# Patient Record
Sex: Male | Born: 1975 | Race: White | Hispanic: No | Marital: Married | Smoking: Never smoker
Health system: Southern US, Community
[De-identification: ages and names within clinical notes are randomized; demographics above are authoritative.]

## PROBLEM LIST (undated history)

## (undated) DIAGNOSIS — W3400XA Accidental discharge from unspecified firearms or gun, initial encounter: Secondary | ICD-10-CM

## (undated) DIAGNOSIS — Y249XXA Unspecified firearm discharge, undetermined intent, initial encounter: Secondary | ICD-10-CM

## (undated) HISTORY — PX: WISDOM TOOTH EXTRACTION: SHX21

## (undated) HISTORY — PX: OTHER SURGICAL HISTORY: SHX169

---

## 2008-01-27 ENCOUNTER — Emergency Department (HOSPITAL_COMMUNITY): Admission: EM | Admit: 2008-01-27 | Discharge: 2008-01-27 | Payer: Self-pay | Admitting: Emergency Medicine

## 2009-06-25 ENCOUNTER — Emergency Department (HOSPITAL_COMMUNITY): Admission: EM | Admit: 2009-06-25 | Discharge: 2009-06-25 | Payer: Self-pay | Admitting: Emergency Medicine

## 2011-03-30 LAB — BASIC METABOLIC PANEL
BUN: 11
CO2: 23
Calcium: 9.2
Chloride: 103
Creatinine, Ser: 1.26

## 2011-03-30 LAB — DIFFERENTIAL
Basophils Absolute: 0
Basophils Relative: 0
Eosinophils Absolute: 0
Eosinophils Relative: 1
Lymphs Abs: 0.3 — ABNORMAL LOW
Neutrophils Relative %: 92 — ABNORMAL HIGH

## 2011-03-30 LAB — HEPATIC FUNCTION PANEL
ALT: 47
Bilirubin, Direct: 0.6 — ABNORMAL HIGH
Indirect Bilirubin: 2.2 — ABNORMAL HIGH
Total Protein: 6.8

## 2011-03-30 LAB — CBC
HCT: 41.1
MCV: 86.9
Platelets: 185
RDW: 12.7
WBC: 7.2

## 2011-03-30 LAB — LIPASE, BLOOD: Lipase: 18

## 2011-03-30 LAB — POCT CARDIAC MARKERS
CKMB, poc: 2.1
Myoglobin, poc: 63
Troponin i, poc: <0.05
Troponin i, poc: <0.05

## 2011-03-30 LAB — D-DIMER, QUANTITATIVE

## 2015-03-03 ENCOUNTER — Emergency Department (INDEPENDENT_AMBULATORY_CARE_PROVIDER_SITE_OTHER)
Admission: EM | Admit: 2015-03-03 | Discharge: 2015-03-03 | Disposition: A | Discharge status: Home / Self Care | Payer: Worker's Compensation | Source: Home / Self Care | Attending: Family Medicine | Admitting: Family Medicine

## 2015-03-03 ENCOUNTER — Encounter (HOSPITAL_COMMUNITY): Payer: Self-pay | Admitting: Emergency Medicine

## 2015-03-03 DIAGNOSIS — M542 Cervicalgia: Secondary | ICD-10-CM | POA: Diagnosis not present

## 2015-03-03 MED ORDER — IBUPROFEN 800 MG PO TABS: 800.0000 mg | ORAL_TABLET | Freq: Three times a day (TID) | ORAL | Status: DC

## 2015-03-03 MED ORDER — CYCLOBENZAPRINE HCL 5 MG PO TABS: 5.0000 mg | ORAL_TABLET | Freq: Three times a day (TID) | ORAL | Status: DC | PRN
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
## 2015-03-03 NOTE — Discharge Instructions (Signed)
Use heat and medicine as needed, ok to work as scheduled, return to [facility] if further problems
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
## 2016-12-03 ENCOUNTER — Other Ambulatory Visit: Payer: Self-pay | Admitting: Occupational Medicine

## 2016-12-03 ENCOUNTER — Ambulatory Visit
Admission: RE | Admit: 2016-12-03 | Discharge: 2016-12-03 | Disposition: A | Discharge status: Home / Self Care | Payer: No Typology Code available for payment source | Source: Ambulatory Visit | Attending: Occupational Medicine | Admitting: Occupational Medicine

## 2016-12-03 DIAGNOSIS — Z0289 Encounter for other administrative examinations: Secondary | ICD-10-CM

## 2018-01-28 ENCOUNTER — Emergency Department (HOSPITAL_COMMUNITY): Payer: 59

## 2018-01-28 ENCOUNTER — Emergency Department (HOSPITAL_COMMUNITY)
Admission: EM | Admit: 2018-01-28 | Discharge: 2018-01-28 | Disposition: A | Discharge status: Home / Self Care | Payer: 59 | Attending: Emergency Medicine | Admitting: Emergency Medicine

## 2018-01-28 ENCOUNTER — Other Ambulatory Visit: Payer: Self-pay

## 2018-01-28 ENCOUNTER — Encounter (HOSPITAL_COMMUNITY): Payer: Self-pay

## 2018-01-28 DIAGNOSIS — Z79899 Other long term (current) drug therapy: Secondary | ICD-10-CM | POA: Diagnosis not present

## 2018-01-28 DIAGNOSIS — R1013 Epigastric pain: Secondary | ICD-10-CM | POA: Insufficient documentation

## 2018-01-28 DIAGNOSIS — R109 Unspecified abdominal pain: Secondary | ICD-10-CM | POA: Diagnosis present

## 2018-01-28 HISTORY — DX: Accidental discharge from unspecified firearms or gun, initial encounter: W34.00XA

## 2018-01-28 HISTORY — DX: Unspecified firearm discharge, undetermined intent, initial encounter: Y24.9XXA

## 2018-01-28 LAB — CBC
HEMATOCRIT: 45.5 % (ref 39.0–52.0)
HEMOGLOBIN: 15.7 g/dL (ref 13.0–17.0)
MCH: 30.3 pg (ref 26.0–34.0)
MCHC: 34.5 g/dL (ref 30.0–36.0)
MCV: 87.8 fL (ref 78.0–100.0)
Platelets: 282 10*3/uL (ref 150–400)
RBC: 5.18 MIL/uL (ref 4.22–5.81)
RDW: 13.6 % (ref 11.5–15.5)
WBC: 6.8 10*3/uL (ref 4.0–10.5)

## 2018-01-28 LAB — COMPREHENSIVE METABOLIC PANEL
ALT: 19 U/L (ref 0–44)
ANION GAP: 9 (ref 5–15)
AST: 23 U/L (ref 15–41)
Albumin: 4.6 g/dL (ref 3.5–5.0)
Alkaline Phosphatase: 35 U/L — ABNORMAL LOW (ref 38–126)
BUN: 15 mg/dL (ref 6–20)
CHLORIDE: 103 mmol/L (ref 98–111)
CO2: 28 mmol/L (ref 22–32)
Calcium: 10.3 mg/dL (ref 8.9–10.3)
Creatinine, Ser: 1.13 mg/dL (ref 0.61–1.24)
GFR calc Af Amer: >60 mL/min (ref >60)
GFR calc non Af Amer: >60 mL/min (ref >60)
Glucose, Bld: 119 mg/dL — ABNORMAL HIGH (ref 70–99)
POTASSIUM: 3.6 mmol/L (ref 3.5–5.1)
Sodium: 140 mmol/L (ref 135–145)
TOTAL PROTEIN: 7.4 g/dL (ref 6.5–8.1)
Total Bilirubin: 0.7 mg/dL (ref 0.3–1.2)

## 2018-01-28 LAB — URINALYSIS, ROUTINE W REFLEX MICROSCOPIC
Bilirubin Urine: NEGATIVE
GLUCOSE, UA: NEGATIVE mg/dL
Hgb urine dipstick: NEGATIVE
KETONES UR: NEGATIVE mg/dL
LEUKOCYTES UA: NEGATIVE
NITRITE: NEGATIVE
PH: 8 (ref 5.0–8.0)
Protein, ur: NEGATIVE mg/dL
SPECIFIC GRAVITY, URINE: 1.003 — AB (ref 1.005–1.030)

## 2018-01-28 LAB — LIPASE, BLOOD: LIPASE: 37 U/L (ref 11–51)

## 2018-01-28 MED ORDER — HYDROMORPHONE HCL 1 MG/ML IJ SOLN
1.0000 mg | Freq: Once | INTRAMUSCULAR | Status: AC
  Administered 2018-01-28: 1 mg via INTRAVENOUS
  Filled 2018-01-28: qty 1

## 2018-01-28 MED ORDER — FAMOTIDINE 20 MG PO TABS: 20.0000 mg | ORAL_TABLET | Freq: Two times a day (BID) | ORAL | 0 refills | Status: DC

## 2018-01-28 MED ORDER — FAMOTIDINE IN NACL 20-0.9 MG/50ML-% IV SOLN
20.0000 mg | Freq: Once | INTRAVENOUS | Status: AC
  Administered 2018-01-28: 20 mg via INTRAVENOUS
  Filled 2018-01-28: qty 50

## 2018-01-28 MED ORDER — ONDANSETRON HCL 4 MG/2ML IJ SOLN
4.0000 mg | Freq: Four times a day (QID) | INTRAMUSCULAR | Status: DC | PRN
  Administered 2018-01-28: 4 mg via INTRAVENOUS
  Filled 2018-01-28: qty 2

## 2018-01-28 MED ORDER — HYDROCODONE-ACETAMINOPHEN 5-325 MG PO TABS: 1.0000 | ORAL_TABLET | Freq: Four times a day (QID) | ORAL | 0 refills | Status: DC | PRN

## 2018-01-28 MED ORDER — IOPAMIDOL (ISOVUE-300) INJECTION 61%
INTRAVENOUS | Status: AC
  Filled 2018-01-28: qty 100

## 2018-01-28 MED ORDER — MORPHINE SULFATE (PF) 4 MG/ML IV SOLN
6.0000 mg | INTRAVENOUS | Status: DC | PRN
  Administered 2018-01-28: 6 mg via INTRAVENOUS
  Filled 2018-01-28: qty 2

## 2018-01-28 MED ORDER — SODIUM CHLORIDE 0.9 % IV SOLN
INTRAVENOUS | Status: DC
  Administered 2018-01-28: 08:00:00 via INTRAVENOUS

## 2018-01-28 MED ORDER — ONDANSETRON HCL 4 MG/2ML IJ SOLN
4.0000 mg | Freq: Once | INTRAMUSCULAR | Status: AC | PRN
  Administered 2018-01-28: 4 mg via INTRAVENOUS
  Filled 2018-01-28: qty 2

## 2018-01-28 MED ORDER — IOPAMIDOL (ISOVUE-300) INJECTION 61%
100.0000 mL | Freq: Once | INTRAVENOUS | Status: AC | PRN
  Administered 2018-01-28: 100 mL via INTRAVENOUS

## 2018-01-28 NOTE — ED Provider Notes (Signed)
Patient with acute onset of epigastric abdominal pain.  Work-up negative from lab standpoint.  Ultrasound negative CT scan abdomen negative.  Peptic ulcer disease not completely ruled out.  Mother did bring up that patient's sister does have celiac disease and it is a possibility.  Will have patient follow-up with GI medicine.  Also will do a course of Pepcid short course of pain medicine as needed.  Will return for any new or worse symptoms.  Patient's pain is currently well controlled.   Vanetta MuldersZackowski, Leshia Kope, MD 01/28/18 [phone]

## 2018-01-28 NOTE — ED Notes (Signed)
States he is feeling better.
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
## 2018-01-28 NOTE — Discharge Instructions (Addendum)
Work-up here today without any acute findings.  Recommend a trial of Pepcid in case this is a stomach ulcer.  Also follow-up with GI medicine call and make an appointment.  As your mother brought up celiac disease is still a possibility and GI medicine could help with this diagnosis.  Return for any new or worse symptoms.  Short course of pain medicine provided as needed.

## 2018-01-28 NOTE — ED Triage Notes (Signed)
Patient states onset abdominal pain at 2100 last night-ate steak,rice, and vegetables at 2000-pain severe since 2230-used tums and pepto with no relief-severe pain/patient doubled over-pain upper mid abdomen to right upper quadrant area-nauseated but no vomiting

## 2018-01-31 ENCOUNTER — Emergency Department (HOSPITAL_COMMUNITY): Payer: 59

## 2018-01-31 ENCOUNTER — Inpatient Hospital Stay (HOSPITAL_COMMUNITY)
Admission: EM | Admit: 2018-01-31 | Discharge: 2018-02-04 | DRG: 419 | Disposition: A | Discharge status: Home / Self Care | Payer: 59 | Attending: General Surgery | Admitting: General Surgery

## 2018-01-31 ENCOUNTER — Emergency Department (HOSPITAL_COMMUNITY)
Admission: EM | Admit: 2018-01-31 | Discharge: 2018-01-31 | Disposition: A | Discharge status: Home / Self Care | Payer: 59 | Source: Home / Self Care | Attending: Emergency Medicine | Admitting: Emergency Medicine

## 2018-01-31 ENCOUNTER — Other Ambulatory Visit: Payer: Self-pay

## 2018-01-31 ENCOUNTER — Encounter (HOSPITAL_COMMUNITY): Payer: Self-pay

## 2018-01-31 ENCOUNTER — Encounter (HOSPITAL_COMMUNITY)
Admission: EM | Disposition: A | Discharge status: Home / Self Care | Payer: Self-pay | Source: Home / Self Care | Attending: Internal Medicine

## 2018-01-31 ENCOUNTER — Encounter (HOSPITAL_COMMUNITY): Payer: Self-pay | Admitting: Emergency Medicine

## 2018-01-31 DIAGNOSIS — K660 Peritoneal adhesions (postprocedural) (postinfection): Secondary | ICD-10-CM | POA: Diagnosis present

## 2018-01-31 DIAGNOSIS — Z79899 Other long term (current) drug therapy: Secondary | ICD-10-CM

## 2018-01-31 DIAGNOSIS — K828 Other specified diseases of gallbladder: Secondary | ICD-10-CM | POA: Diagnosis present

## 2018-01-31 DIAGNOSIS — K279 Peptic ulcer, site unspecified, unspecified as acute or chronic, without hemorrhage or perforation: Secondary | ICD-10-CM | POA: Insufficient documentation

## 2018-01-31 DIAGNOSIS — K811 Chronic cholecystitis: Secondary | ICD-10-CM | POA: Diagnosis not present

## 2018-01-31 DIAGNOSIS — Z880 Allergy status to penicillin: Secondary | ICD-10-CM

## 2018-01-31 DIAGNOSIS — R109 Unspecified abdominal pain: Secondary | ICD-10-CM | POA: Diagnosis not present

## 2018-01-31 DIAGNOSIS — R1013 Epigastric pain: Secondary | ICD-10-CM | POA: Insufficient documentation

## 2018-01-31 HISTORY — PX: BIOPSY: SHX5522

## 2018-01-31 HISTORY — PX: ESOPHAGOGASTRODUODENOSCOPY: SHX5428

## 2018-01-31 LAB — COMPREHENSIVE METABOLIC PANEL
ALBUMIN: 4.9 g/dL (ref 3.5–5.0)
ALT: 27 U/L (ref 0–44)
ALT: 29 U/L (ref 0–44)
ANION GAP: 11 (ref 5–15)
AST: 25 U/L (ref 15–41)
AST: 27 U/L (ref 15–41)
Albumin: 4.8 g/dL (ref 3.5–5.0)
Alkaline Phosphatase: 33 U/L — ABNORMAL LOW (ref 38–126)
Alkaline Phosphatase: 37 U/L — ABNORMAL LOW (ref 38–126)
Anion gap: 8 (ref 5–15)
BILIRUBIN TOTAL: 0.6 mg/dL (ref 0.3–1.2)
BUN: 12 mg/dL (ref 6–20)
BUN: 10 mg/dL (ref 6–20)
CHLORIDE: 103 mmol/L (ref 98–111)
CHLORIDE: 104 mmol/L (ref 98–111)
CO2: 31 mmol/L (ref 22–32)
CO2: 24 mmol/L (ref 22–32)
CREATININE: 1.14 mg/dL (ref 0.61–1.24)
Calcium: 9.3 mg/dL (ref 8.9–10.3)
Calcium: 9.8 mg/dL (ref 8.9–10.3)
Creatinine, Ser: 1.09 mg/dL (ref 0.61–1.24)
GFR calc Af Amer: >60 mL/min (ref >60)
GFR calc non Af Amer: >60 mL/min (ref >60)
GLUCOSE: 106 mg/dL — AB (ref 70–99)
Glucose, Bld: 105 mg/dL — ABNORMAL HIGH (ref 70–99)
POTASSIUM: 3.9 mmol/L (ref 3.5–5.1)
Potassium: 3.9 mmol/L (ref 3.5–5.1)
SODIUM: 142 mmol/L (ref 135–145)
SODIUM: 139 mmol/L (ref 135–145)
TOTAL PROTEIN: 7.7 g/dL (ref 6.5–8.1)
Total Bilirubin: 0.9 mg/dL (ref 0.3–1.2)
Total Protein: 7.7 g/dL (ref 6.5–8.1)

## 2018-01-31 LAB — CBC
HCT: 45.4 % (ref 39.0–52.0)
HEMATOCRIT: 49.1 % (ref 39.0–52.0)
HEMOGLOBIN: 17.1 g/dL — AB (ref 13.0–17.0)
Hemoglobin: 15.9 g/dL (ref 13.0–17.0)
MCH: 30.5 pg (ref 26.0–34.0)
MCH: 30.4 pg (ref 26.0–34.0)
MCHC: 35 g/dL (ref 30.0–36.0)
MCHC: 34.8 g/dL (ref 30.0–36.0)
MCV: 87.1 fL (ref 78.0–100.0)
MCV: 87.2 fL (ref 78.0–100.0)
PLATELETS: 271 10*3/uL (ref 150–400)
Platelets: 296 10*3/uL (ref 150–400)
RBC: 5.21 MIL/uL (ref 4.22–5.81)
RBC: 5.63 MIL/uL (ref 4.22–5.81)
RDW: 13.5 % (ref 11.5–15.5)
RDW: 13.6 % (ref 11.5–15.5)
WBC: 4.8 10*3/uL (ref 4.0–10.5)
WBC: 10.4 10*3/uL (ref 4.0–10.5)

## 2018-01-31 LAB — URINALYSIS, ROUTINE W REFLEX MICROSCOPIC
BILIRUBIN URINE: NEGATIVE
GLUCOSE, UA: NEGATIVE mg/dL
Hgb urine dipstick: NEGATIVE
KETONES UR: NEGATIVE mg/dL
Leukocytes, UA: NEGATIVE
NITRITE: NEGATIVE
Protein, ur: NEGATIVE mg/dL
SPECIFIC GRAVITY, URINE: 1.006 (ref 1.005–1.030)
pH: 8 (ref 5.0–8.0)

## 2018-01-31 LAB — LIPASE, BLOOD
LIPASE: 42 U/L (ref 11–51)
Lipase: 35 U/L (ref 11–51)

## 2018-01-31 LAB — D-DIMER, QUANTITATIVE (NOT AT ARMC)

## 2018-01-31 SURGERY — EGD (ESOPHAGOGASTRODUODENOSCOPY)

## 2018-01-31 MED ORDER — DIPHENHYDRAMINE HCL 50 MG/ML IJ SOLN
INTRAMUSCULAR | Status: AC
  Filled 2018-01-31: qty 1

## 2018-01-31 MED ORDER — MORPHINE SULFATE (PF) 2 MG/ML IV SOLN
2.0000 mg | INTRAVENOUS | Status: DC | PRN
  Administered 2018-02-01: 2 mg via INTRAVENOUS
  Filled 2018-01-31: qty 1

## 2018-01-31 MED ORDER — MORPHINE SULFATE (PF) 4 MG/ML IV SOLN
4.0000 mg | Freq: Once | INTRAVENOUS | Status: AC
  Administered 2018-01-31: 4 mg via INTRAVENOUS
  Filled 2018-01-31: qty 1

## 2018-01-31 MED ORDER — FENTANYL CITRATE (PF) 100 MCG/2ML IJ SOLN
INTRAMUSCULAR | Status: AC
  Filled 2018-01-31: qty 4

## 2018-01-31 MED ORDER — MORPHINE SULFATE 15 MG PO TABS: 15.0000 mg | ORAL_TABLET | Freq: Three times a day (TID) | ORAL | 0 refills | Status: DC | PRN

## 2018-01-31 MED ORDER — FENTANYL CITRATE (PF) 100 MCG/2ML IJ SOLN
50.0000 ug | Freq: Once | INTRAMUSCULAR | Status: AC
  Administered 2018-01-31: 50 ug via INTRAVENOUS
  Filled 2018-01-31: qty 2

## 2018-01-31 MED ORDER — MIDAZOLAM HCL 5 MG/ML IJ SOLN
INTRAMUSCULAR | Status: AC
  Filled 2018-01-31: qty 2

## 2018-01-31 MED ORDER — MORPHINE SULFATE 15 MG PO TABS
15.0000 mg | ORAL_TABLET | Freq: Three times a day (TID) | ORAL | Status: DC | PRN
  Administered 2018-02-03: 15 mg via ORAL
  Filled 2018-01-31: qty 1

## 2018-01-31 MED ORDER — ONDANSETRON HCL 4 MG PO TABS
4.0000 mg | ORAL_TABLET | Freq: Four times a day (QID) | ORAL | Status: DC | PRN
  Filled 2018-01-31: qty 1

## 2018-01-31 MED ORDER — GI COCKTAIL ~~LOC~~
30.0000 mL | Freq: Once | ORAL | Status: AC
  Administered 2018-01-31: 30 mL via ORAL
  Filled 2018-01-31: qty 30

## 2018-01-31 MED ORDER — ONDANSETRON HCL 4 MG/2ML IJ SOLN: 4.0000 mg | Freq: Four times a day (QID) | INTRAMUSCULAR | Status: DC | PRN

## 2018-01-31 MED ORDER — ACETAMINOPHEN 650 MG RE SUPP: 650.0000 mg | Freq: Four times a day (QID) | RECTAL | Status: DC | PRN

## 2018-01-31 MED ORDER — MORPHINE SULFATE 15 MG PO TABS
15.0000 mg | ORAL_TABLET | ORAL | Status: DC | PRN
  Administered 2018-01-31: 15 mg via ORAL
  Filled 2018-01-31: qty 1

## 2018-01-31 MED ORDER — ACETAMINOPHEN 325 MG PO TABS
650.0000 mg | ORAL_TABLET | Freq: Four times a day (QID) | ORAL | Status: DC | PRN
  Filled 2018-01-31: qty 2

## 2018-01-31 MED ORDER — PANTOPRAZOLE SODIUM 40 MG PO TBEC
40.0000 mg | DELAYED_RELEASE_TABLET | Freq: Two times a day (BID) | ORAL | Status: DC
  Administered 2018-02-01 – 2018-02-04 (×6): 40 mg via ORAL
  Filled 2018-01-31 (×7): qty 1

## 2018-01-31 MED ORDER — KCL IN DEXTROSE-NACL 20-5-0.45 MEQ/L-%-% IV SOLN
Freq: Once | INTRAVENOUS | Status: AC
  Administered 2018-01-31: 21:00:00 via INTRAVENOUS
  Filled 2018-01-31: qty 1000

## 2018-01-31 MED ORDER — PANTOPRAZOLE SODIUM 20 MG PO TBEC: 20.0000 mg | DELAYED_RELEASE_TABLET | Freq: Two times a day (BID) | ORAL | Status: DC

## 2018-01-31 MED ORDER — ONDANSETRON HCL 4 MG PO TABS: 4.0000 mg | ORAL_TABLET | Freq: Four times a day (QID) | ORAL | Status: DC | PRN

## 2018-01-31 MED ORDER — ENOXAPARIN SODIUM 40 MG/0.4ML ~~LOC~~ SOLN
40.0000 mg | Freq: Every day | SUBCUTANEOUS | Status: DC
  Filled 2018-01-31 (×2): qty 0.4

## 2018-01-31 MED ORDER — SUCRALFATE 1 GM/10ML PO SUSP: 1.0000 g | Freq: Three times a day (TID) | ORAL | 0 refills | Status: DC

## 2018-01-31 MED ORDER — ACETAMINOPHEN 650 MG RE SUPP
650.0000 mg | Freq: Four times a day (QID) | RECTAL | Status: DC | PRN
  Filled 2018-01-31: qty 1

## 2018-01-31 MED ORDER — PANTOPRAZOLE SODIUM 40 MG PO TBEC
80.0000 mg | DELAYED_RELEASE_TABLET | Freq: Every day | ORAL | Status: DC
  Administered 2018-01-31: 80 mg via ORAL
  Filled 2018-01-31: qty 2

## 2018-01-31 MED ORDER — ACETAMINOPHEN 325 MG PO TABS: 650.0000 mg | ORAL_TABLET | Freq: Four times a day (QID) | ORAL | Status: DC | PRN

## 2018-01-31 MED ORDER — SUCRALFATE 1 G PO TABS
1.0000 g | ORAL_TABLET | Freq: Once | ORAL | Status: AC
  Administered 2018-01-31: 1 g via ORAL
  Filled 2018-01-31: qty 1

## 2018-01-31 MED ORDER — BUTAMBEN-TETRACAINE-BENZOCAINE 2-2-14 % EX AERO
INHALATION_SPRAY | CUTANEOUS | Status: DC | PRN
  Administered 2018-01-31: 2 via TOPICAL

## 2018-01-31 MED ORDER — PANTOPRAZOLE SODIUM 20 MG PO TBEC: 20.0000 mg | DELAYED_RELEASE_TABLET | Freq: Two times a day (BID) | ORAL | 0 refills | Status: DC

## 2018-01-31 MED ORDER — HYDRALAZINE HCL 20 MG/ML IJ SOLN: 10.0000 mg | INTRAMUSCULAR | Status: DC | PRN

## 2018-01-31 MED ORDER — ALBUTEROL SULFATE (2.5 MG/3ML) 0.083% IN NEBU: 2.5000 mg | INHALATION_SOLUTION | RESPIRATORY_TRACT | Status: DC | PRN
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
## 2018-01-31 NOTE — ED Notes (Signed)
XR at bedside
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
## 2018-01-31 NOTE — ED Notes (Signed)
Pt aware of need for urine specimen. Urinal placed at bedside.  

## 2018-01-31 NOTE — ED Notes (Signed)
GI at bedside

## 2018-01-31 NOTE — Progress Notes (Addendum)
EGD normal, negative for source of abdominal pain.  Have ordered CT of abdomen with oral contrast to see if anything has evolved radiographically in the past 72 hours.  Will cover with PPI therapy.  Case discussed with patient, father, and hospitalist.  NOTE: Pt's mother points out that there's a family history of gallbladder disease in his sister, who had severe pain similar to this patient, but had a negative ultrasound for stones or sludge.  If repeat CT is unrevealing, we should consider a CCK-stimulated hepatobiliary scan.  We will follow with you over the weekend.  Florencia Reasonsobert V. Minnette Merida, M.D. Pager [phone] If no answer or after 5 PM call [phone]

## 2018-01-31 NOTE — ED Notes (Signed)
Patient reports pain unchanged with morphine. MD made aware.
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
## 2018-01-31 NOTE — ED Notes (Signed)
Bed: WA17 Expected date:  Expected time:  Means of arrival:  Comments: Hold for endo patient coming back

## 2018-01-31 NOTE — ED Notes (Signed)
Bed: WLPT4 Expected date:  Expected time:  Means of arrival:  Comments: 

## 2018-01-31 NOTE — Discharge Instructions (Addendum)
We suspect that your pain is because of peptic ulcer disease. Please continue with the plan to follow-up with the GI doctor.  As discussed, HIDA scan might help out with the diagnosis if this is a gallbladder pathology that does not involve gallstones. The GI doctor can order the HIDA scan if they feel it is necessary.  Keep taking the medications that are prescribed. Refrain from eating spicy food.

## 2018-01-31 NOTE — ED Triage Notes (Signed)
Pt from home with c/o central epigastric pain that began around 2100 tonight. Pt states he has been seen for same with no change, just increase in pain. Pt states he took Pepcid and pain med as instructed with no relief

## 2018-01-31 NOTE — Op Note (Signed)
[facility] Patient Name: Daniel Skinner Procedure Date: 01/31/2018 MRN: [id number] Attending MD: Bernette Redbird , MD Date of Birth: Jun 06, 1976 CSN: [id number] Age: 42 Admit Type: Inpatient Procedure:                Upper GI endoscopy Indications:              Epigastric abdominal pain, intense, on and off for                            several days, u/s and CT neg. Also minimal                            hematemesis during dry heaves. Providers:                Bernette Redbird, MD, Jacquiline Doe, RN, Verita Schneiders, Technician Referring MD:              Medicines:                Benzocaine spray NO IV SEDATION Complications:            No immediate complications. Estimated Blood Loss:     Estimated blood loss was minimal. Procedure:                Pre-Anesthesia Assessment:                           - Prior to the procedure, a History and Physical                            was performed, and patient medications and                            allergies were reviewed. The patient's tolerance of                            previous anesthesia was also reviewed. The risks                            and benefits of the procedure and the sedation                            options and risks were discussed with the patient.                            All questions were answered, and informed consent                            was obtained. Prior Anticoagulants: The patient has                            taken no previous anticoagulant or antiplatelet  agents. ASA Grade Assessment: II - A patient with                            mild systemic disease. After reviewing the risks                            and benefits, the patient was deemed in                            satisfactory condition to undergo the procedure.                           After obtaining informed consent, the endoscope was                            passed  under direct vision. Throughout the                            procedure, the patient's blood pressure, pulse, and                            oxygen saturations were monitored continuously. The                            [id number]-[id number] ([id number]) Olympus Ultra Slim EGD was                            introduced through the mouth, and advanced to the                            third part of duodenum. The upper GI endoscopy was                            accomplished without difficulty. The patient                            tolerated the procedure well. Scope In: Scope Out: Findings:      The larynx was normal.      The examined esophagus was normal.      Retained fluid (slightly bile-tinged) with a small amount of food       debris, was found in the gastric fundus.      The exam of the stomach was otherwise normal.      The cardia and gastric fundus were normal on retroflexion.      Biopsies were taken with a cold forceps in the gastric antrum for       histology. Estimated blood loss was minimal.      The examined duodenum was normal. Biopsies were taken with a cold       forceps for histology. Estimated blood loss was minimal. Impression:               - Normal larynx.                           - Normal esophagus.                           -  Retained gastric fluid.                           - Normal examined duodenum. Biopsied.                           - Biopsies were taken with a cold forceps for                            histology in the gastric antrum.                           - No evidence of ischemia, erosions, ulcers,                            gastric outlet obstruction, or other cause for                            abdominal pain seen. Moderate Sedation:      No IV sedation administered. Recommendation:           - Await pathology results.                           - Empiric PPI therapy.                           - Repeat CT of abdomen, this time with oral                             contrast.                           - Observe patient's clinical course. Procedure Code(s):        --- Professional ---                           [id number], Esophagogastroduodenoscopy, flexible,                            transoral; with biopsy, single or multiple Diagnosis Code(s):        --- Professional ---                           R10.13, Epigastric pain CPT copyright 2017 American Medical Association. All rights reserved. The codes documented in this report are preliminary and upon coder review may  be revised to meet current compliance requirements. Bernette Redbirdobert Tanice Petre, MD 01/31/2018 10:45:02 PM This report has been signed electronically. Number of Addenda: 0

## 2018-01-31 NOTE — ED Triage Notes (Signed)
Pt c/o of continued central abdominal pain. Pt is doubled over in pain and in obvious disress. He also reports hematemesis. Endorses normal urination and bowel movements. A&Ox4.

## 2018-01-31 NOTE — ED Notes (Signed)
EKG given to EDP,Long,MD., for review. 

## 2018-01-31 NOTE — ED Notes (Addendum)
Patient's mother, Talbert ForestShirley, [phone].

## 2018-02-01 ENCOUNTER — Encounter (HOSPITAL_COMMUNITY): Payer: Self-pay

## 2018-02-01 ENCOUNTER — Observation Stay (HOSPITAL_COMMUNITY): Payer: 59

## 2018-02-01 DIAGNOSIS — R109 Unspecified abdominal pain: Secondary | ICD-10-CM | POA: Diagnosis not present

## 2018-02-01 LAB — IRON AND TIBC
Iron: 107 ug/dL (ref 45–182)
SATURATION RATIOS: 22 % (ref 17.9–39.5)
TIBC: 497 ug/dL — AB (ref 250–450)
UIBC: 390 ug/dL

## 2018-02-01 LAB — COMPREHENSIVE METABOLIC PANEL
ALBUMIN: 4.4 g/dL (ref 3.5–5.0)
ALK PHOS: 36 U/L — AB (ref 38–126)
ALT: 25 U/L (ref 0–44)
AST: 23 U/L (ref 15–41)
Anion gap: 10 (ref 5–15)
BILIRUBIN TOTAL: 1 mg/dL (ref 0.3–1.2)
BUN: 12 mg/dL (ref 6–20)
CO2: 25 mmol/L (ref 22–32)
CREATININE: 1.1 mg/dL (ref 0.61–1.24)
Calcium: 9.3 mg/dL (ref 8.9–10.3)
Chloride: 107 mmol/L (ref 98–111)
GFR calc non Af Amer: >60 mL/min (ref >60)
GLUCOSE: 101 mg/dL — AB (ref 70–99)
Potassium: 4.5 mmol/L (ref 3.5–5.1)
Sodium: 142 mmol/L (ref 135–145)
TOTAL PROTEIN: 7 g/dL (ref 6.5–8.1)

## 2018-02-01 LAB — RAPID URINE DRUG SCREEN, HOSP PERFORMED
AMPHETAMINES: NOT DETECTED
BENZODIAZEPINES: NOT DETECTED
Barbiturates: POSITIVE — AB
COCAINE: NOT DETECTED
OPIATES: POSITIVE — AB
Tetrahydrocannabinol: NOT DETECTED

## 2018-02-01 LAB — RETICULOCYTES
RBC.: 5.72 MIL/uL (ref 4.22–5.81)
RETIC COUNT ABSOLUTE: 57.2 10*3/uL (ref 19.0–186.0)
RETIC CT PCT: 1 % (ref 0.4–3.1)

## 2018-02-01 LAB — CBC
HCT: 48.4 % (ref 39.0–52.0)
Hemoglobin: 16.7 g/dL (ref 13.0–17.0)
MCH: 30.4 pg (ref 26.0–34.0)
MCHC: 34.5 g/dL (ref 30.0–36.0)
MCV: 88 fL (ref 78.0–100.0)
Platelets: 306 10*3/uL (ref 150–400)
RBC: 5.5 MIL/uL (ref 4.22–5.81)
RDW: 13.5 % (ref 11.5–15.5)
WBC: 8.9 10*3/uL (ref 4.0–10.5)

## 2018-02-01 LAB — LACTIC ACID, PLASMA: Lactic Acid, Venous: 0.8 mmol/L (ref 0.5–1.9)

## 2018-02-01 LAB — HIV ANTIBODY (ROUTINE TESTING W REFLEX): HIV SCREEN 4TH GENERATION: NONREACTIVE

## 2018-02-01 LAB — FERRITIN: FERRITIN: 252 ng/mL (ref 24–336)

## 2018-02-01 LAB — FOLATE: Folate: 17.2 ng/mL (ref >5.9)

## 2018-02-01 LAB — VITAMIN B12: VITAMIN B 12: 424 pg/mL (ref 180–914)

## 2018-02-01 MED ORDER — IOPAMIDOL (ISOVUE-300) INJECTION 61%
100.0000 mL | Freq: Once | INTRAVENOUS | Status: AC | PRN
  Administered 2018-02-01: 100 mL via INTRAVENOUS

## 2018-02-01 MED ORDER — SODIUM CHLORIDE 0.9 % IV SOLN
INTRAVENOUS | Status: DC
  Administered 2018-02-01 – 2018-02-04 (×6): via INTRAVENOUS

## 2018-02-01 MED ORDER — MORPHINE SULFATE (PF) 4 MG/ML IV SOLN
4.0000 mg | INTRAVENOUS | Status: DC | PRN
  Administered 2018-02-01 – 2018-02-04 (×7): 4 mg via INTRAVENOUS
  Filled 2018-02-01 (×7): qty 1

## 2018-02-01 MED ORDER — SUCRALFATE 1 GM/10ML PO SUSP
1.0000 g | Freq: Three times a day (TID) | ORAL | Status: DC
  Administered 2018-02-01 – 2018-02-03 (×9): 1 g via ORAL
  Filled 2018-02-01 (×10): qty 10

## 2018-02-01 MED ORDER — SODIUM CHLORIDE 0.9 % IV SOLN: INTRAVENOUS | Status: DC

## 2018-02-01 MED ORDER — IOPAMIDOL (ISOVUE-300) INJECTION 61%
INTRAVENOUS | Status: AC
  Filled 2018-02-01: qty 100

## 2018-02-01 MED ORDER — IOHEXOL 300 MG/ML  SOLN: 30.0000 mL | Freq: Once | INTRAMUSCULAR | Status: DC | PRN

## 2018-02-01 NOTE — Progress Notes (Signed)
[facility] PROGRESS NOTE    Progress Note  Daniel MurdochJeremy L Seymore  BJY:[id number] DOB: Oct 04, 1975 DOA: 01/31/2018 PCP: Patient, No Pcp Per     Brief Narrative:   Daniel Skinner is an 42 y.o. male without a significant past medical history who presented with intermittent epigastric pain over the last 4 days from the navel to the sternum radiates to the right quadrant, CT scan of the abdomen and pelvis was done  Assessment/Plan:   Principal Problem:   Recurrent abdominal pain And pelvis done on 01/28/2018 showed no acute findings. ED done on 01/2018 showed no acute findings he was started empirically on PPI. Tolerating a clear liquid diet. KUB showed no acute findings. Abdominal ultrasound showed no acute findings. Alkaline phosphatase is 36 AST and ALT are 23 and 25 his total bilirubin is 1 total protein is 7. Repeated CT scan of the abdomen pelvis with contrast is pending.  DVT prophylaxis: lovenox Family Communication:mother and father Disposition Plan/Barrier to D/C: none Code Status:     Code Status Orders  (From admission, onward)        Start     Ordered   01/31/18 2254  Full code  Continuous     01/31/18 2255    Code Status History    This patient has a current code status but no historical code status.        IV Access:    Peripheral IV   Procedures and diagnostic studies:   Dg Abdomen 1 View  Result Date: 01/31/2018 CLINICAL DATA:  Abdominal pain since 2 a.m. today. EXAM: ABDOMEN - 1 VIEW COMPARISON:  Abdomen pelvis CT dated 01/28/2018. FINDINGS: Normal bowel gas pattern.  Minimal scoliosis. IMPRESSION: No acute abnormality. Electronically Signed   By: Beckie SaltsSteven  Reid M.D.   On: 01/31/2018 19:09     Medical Consultants:    None.  Anti-Infectives:   None  Subjective:    Daniel MurdochJeremy L Miyasaki he relates he continues to have abdominal pain.  Objective:    Vitals:   02/01/18 0000 02/01/18 0030 02/01/18 0146 02/01/18 0601  BP: 118/73 129/88  (!) 148/95   Pulse: (!) 56 (!) 54 (!) 59   Resp: 12 17 18    Temp:   98.2 F (36.8 C)   TempSrc:   Oral   SpO2: 95% 95% 100%   Weight:    70.3 kg (155 lb)  Height:    5\' 8"  (1.727 m)    Intake/Output Summary (Last 24 hours) at 02/01/2018 0851 Last data filed at 02/01/2018 0844 Gross per 24 hour  Intake 121.08 ml  Output -  Net 121.08 ml   Filed Weights   02/01/18 0601  Weight: 70.3 kg (155 lb)    Exam: General exam: In no acute distress. Respiratory system: Good air movement and clear to auscultation. Cardiovascular system: S1 & S2 heard, RRR. No JVD. Gastrointestinal system: Abdomen is nondistended, soft and nontender.  Central nervous system: Alert and oriented. No focal neurological deficits. Extremities: No pedal edema. Skin: No rashes, lesions or ulcers Psychiatry: Judgement and insight appear normal. Mood & affect appropriate.    Data Reviewed:    Labs: Basic Metabolic Panel: Recent Labs  Lab 01/28/18 0502 01/31/18 0313 01/31/18 1800 02/01/18 0518  NA 140 142 139 142  K 3.6 3.9 3.9 4.5  CL 103 103 104 107  CO2 28 31 24 25   GLUCOSE 119* 105* 106* 101*  BUN 15 12 10 12   CREATININE 1.13 1.14 1.09 1.10  CALCIUM  10.3 9.3 9.8 9.3   GFR Estimated Creatinine Clearance: 86.4 mL/min (by C-G formula based on SCr of 1.1 mg/dL). Liver Function Tests: Recent Labs  Lab 01/28/18 0502 01/31/18 0313 01/31/18 1800 02/01/18 0518  AST 23 25 27 23   ALT 19 27 29 25   ALKPHOS 35* 33* 37* 36*  BILITOT 0.7 0.6 0.9 1.0  PROT 7.4 7.7 7.7 7.0  ALBUMIN 4.6 4.8 4.9 4.4   Recent Labs  Lab 01/28/18 0502 01/31/18 0313 01/31/18 1800  LIPASE 37 35 42   No results for input(s): AMMONIA in the last 168 hours. Coagulation profile No results for input(s): INR, PROTIME in the last 168 hours.  CBC: Recent Labs  Lab 01/28/18 0502 01/31/18 0313 01/31/18 1800 02/01/18 0518  WBC 6.8 4.8 10.4 8.9  HGB 15.7 15.9 17.1* 16.7  HCT 45.5 45.4 49.1 48.4  MCV 87.8 87.1 87.2 88.0    PLT 282 271 296 306   Cardiac Enzymes: No results for input(s): CKTOTAL, CKMB, CKMBINDEX, TROPONINI in the last 168 hours. BNP (last 3 results) No results for input(s): PROBNP in the last 8760 hours. CBG: No results for input(s): GLUCAP in the last 168 hours. D-Dimer: Recent Labs    01/31/18 1834  DDIMER <0.27   Hgb A1c: No results for input(s): HGBA1C in the last 72 hours. Lipid Profile: No results for input(s): CHOL, HDL, LDLCALC, TRIG, CHOLHDL, LDLDIRECT in the last 72 hours. Thyroid function studies: No results for input(s): TSH, T4TOTAL, T3FREE, THYROIDAB in the last 72 hours.  Invalid input(s): FREET3 Anemia work up: No results for input(s): VITAMINB12, FOLATE, FERRITIN, TIBC, IRON, RETICCTPCT in the last 72 hours. Sepsis Labs: Recent Labs  Lab 01/28/18 0502 01/31/18 0313 01/31/18 1800 01/31/18 2330 02/01/18 0518  WBC 6.8 4.8 10.4  --  8.9  LATICACIDVEN  --   --   --  0.8  --    Microbiology No results found for this or any previous visit (from the past 240 hour(s)).   Medications:   . enoxaparin (LOVENOX) injection  40 mg Subcutaneous QHS  . pantoprazole  40 mg Oral BID AC   Continuous Infusions: . sodium chloride    . sodium chloride 75 mL/hr at 02/01/18 0844     LOS: 0 days   Marinda Elk  [facility] Pager [phone]  *Please refer to [email or web address], password [email or web address] to get updated schedule on who will round on this patient, as hospitalists switch teams weekly. If 7PM-7AM, please contact night-coverage at [email or web address], password [email or web address] for any overnight needs.  02/01/2018, 8:51 AM

## 2018-02-01 NOTE — Progress Notes (Signed)
Repeat CT scan was unrevealing for evolution of any identifiable process to account for his pain, such as ischemic small bowel disease.  I checked with radiology; the gallbladder ejection fraction nuclear medicine scan is not available on weekends.  At my recommendation, the patient was holding off on pain medication until we clarified nuclear medicine study.  Whether he would be having the he therefore went all day without pain medication, and although he has not had any of the excruciating pain that prompted him to come into the hospital yesterday, he has had a gradual escalation of the severity of his ongoing discomfort, from a level of 3 to perhaps 5.  On exam, the patient is in no distress, bowel sounds are present, no overt abdominal tenderness.  Impression: Recent onset of severe upper abdominal pain of unclear cause.  At the moment, we wonder about possible acalculous gallbladder disease, since there is a family history of that in his sister, as well as gallbladder disease with sludge in his mother.  Other possibilities would be some sort of mesenteric mesenteric vasculitis.  There is a family history of celiac disease although the presentation does not seem compatible with that disorder.  Plan:  1.  Await duodenal and gastric biopsies 2.  Check for markers of inflammation (CRP and sed rate) (ordered) 3.  Check celiac serologies (ordered) 4.  I encouraged the patient to use his pain medicine before the pain escalates to very high levels.  Also, if the currently ordered pain medication dose is not sufficient to mitigate the pain reasonably well, he should notify his nurse who could notify the attending hospitalist physician to consider increasing the dose of medication.  I have discussed this with the hospitalist physician. 5.  I have ordered the nuclear medicine gallbladder ejection fraction study for Monday, along with an n.p.o. order, and I have advised the patient to try to avoid use of  opiate medication starting midnight Sunday night, so as to not interfere with that test.  Daniel Skinner V. Jakye Mullens, M.D. Pager [phone] If no answer or after 5 PM call [phone]

## 2018-02-01 NOTE — Progress Notes (Signed)
Report received from R. Corcoran,RN. No change in assessment.Calyb Mcquarrie Johnson 
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
## 2018-02-02 ENCOUNTER — Encounter (HOSPITAL_COMMUNITY): Payer: Self-pay | Admitting: Gastroenterology

## 2018-02-02 DIAGNOSIS — K660 Peritoneal adhesions (postprocedural) (postinfection): Secondary | ICD-10-CM | POA: Diagnosis present

## 2018-02-02 DIAGNOSIS — K811 Chronic cholecystitis: Secondary | ICD-10-CM | POA: Diagnosis present

## 2018-02-02 DIAGNOSIS — K828 Other specified diseases of gallbladder: Secondary | ICD-10-CM | POA: Diagnosis present

## 2018-02-02 DIAGNOSIS — K819 Cholecystitis, unspecified: Secondary | ICD-10-CM | POA: Diagnosis not present

## 2018-02-02 DIAGNOSIS — Z880 Allergy status to penicillin: Secondary | ICD-10-CM | POA: Diagnosis not present

## 2018-02-02 DIAGNOSIS — R109 Unspecified abdominal pain: Secondary | ICD-10-CM | POA: Diagnosis present

## 2018-02-02 DIAGNOSIS — Z79899 Other long term (current) drug therapy: Secondary | ICD-10-CM | POA: Diagnosis not present

## 2018-02-02 LAB — C-REACTIVE PROTEIN: CRP: <0.8 mg/dL (ref <1.0)

## 2018-02-02 LAB — SEDIMENTATION RATE: Sed Rate: 1 mm/hr (ref 0–16)

## 2018-02-02 MED ORDER — HYOSCYAMINE SULFATE 0.125 MG SL SUBL
0.2500 mg | SUBLINGUAL_TABLET | SUBLINGUAL | Status: DC | PRN
  Administered 2018-02-02 – 2018-02-03 (×3): 0.25 mg via SUBLINGUAL
  Filled 2018-02-02 (×4): qty 2

## 2018-02-02 MED ORDER — DICYCLOMINE HCL 20 MG PO TABS
20.0000 mg | ORAL_TABLET | Freq: Three times a day (TID) | ORAL | Status: AC
  Administered 2018-02-02 (×2): 20 mg via ORAL
  Filled 2018-02-02 (×2): qty 1

## 2018-02-02 MED ORDER — POLYETHYLENE GLYCOL 3350 17 G PO PACK
17.0000 g | PACK | Freq: Two times a day (BID) | ORAL | Status: AC
  Administered 2018-02-02: 17 g via ORAL
  Filled 2018-02-02: qty 1

## 2018-02-02 MED ORDER — HYDROMORPHONE HCL 1 MG/ML IJ SOLN
0.5000 mg | Freq: Once | INTRAMUSCULAR | Status: AC
  Administered 2018-02-02: 0.5 mg via INTRAVENOUS
  Filled 2018-02-02: qty 0.5

## 2018-02-02 NOTE — Progress Notes (Signed)
[facility] PROGRESS NOTE    Progress Note  Daniel Skinner  OMB:[id number] DOB: 30-Jul-1975 DOA: 01/31/2018 PCP: Patient, No Pcp Per     Brief Narrative:   Daniel Skinner is an 42 y.o. male without a significant past medical history who presented with intermittent epigastric pain over the last 4 days from the navel to the sternum radiates to the right quadrant, CT scan of the abdomen and pelvis was done  Assessment/Plan:   Principal Problem:   Recurrent abdominal pain CT scan of the abdomen and pelvis done on 01/28/2018 showed no acute findings. ED done on 01/2018 showed no acute findings he was started empirically on PPI. Tolerating a clear liquid diet. KUB showed no acute findings. Abdominal ultrasound showed no acute findings. Alkaline phosphatase is 36 AST and ALT are 23 and 25 his total bilirubin is 1 total protein is 7. Repeated CT scan of the abdomen pelvis with contrast show no acute findings. GI on board the recommended HIDA scan will also get immunologic panel for celiac disease and an ESR is 1. UDS was positive for barbiturates which she does not have a prescription.  DVT prophylaxis: lovenox Family Communication:mother and father Disposition Plan/Barrier to D/C: none Code Status:     Code Status Orders  (From admission, onward)        Start     Ordered   01/31/18 2254  Full code  Continuous     01/31/18 2255    Code Status History    This patient has a current code status but no historical code status.        IV Access:    Peripheral IV   Procedures and diagnostic studies:   Dg Abdomen 1 View  Result Date: 01/31/2018 CLINICAL DATA:  Abdominal pain since 2 a.m. today. EXAM: ABDOMEN - 1 VIEW COMPARISON:  Abdomen pelvis CT dated 01/28/2018. FINDINGS: Normal bowel gas pattern.  Minimal scoliosis. IMPRESSION: No acute abnormality. Electronically Signed   By: Claudie Revering M.D.   On: 01/31/2018 19:09   Ct Abdomen Pelvis W Contrast  Result Date:  02/01/2018 CLINICAL DATA:  Epigastric pain for several days EXAM: CT ABDOMEN AND PELVIS WITH CONTRAST TECHNIQUE: Multidetector CT imaging of the abdomen and pelvis was performed using the standard protocol following bolus administration of intravenous contrast. CONTRAST:  100 mL Isovue-300 COMPARISON:  01/28/2018 FINDINGS: Lower chest: No acute abnormality. Hepatobiliary: No focal liver abnormality is seen. Area of focal fatty infiltration is identified adjacent to the falciform ligament. No gallstones, gallbladder wall thickening, or biliary dilatation. Pancreas: Unremarkable. No pancreatic ductal dilatation or surrounding inflammatory changes. Spleen: Normal in size without focal abnormality. Adrenals/Urinary Tract: Adrenal glands are unremarkable. Kidneys are normal, without renal calculi, focal lesion, or hydronephrosis. Bladder is unremarkable. Stomach/Bowel: Stomach is within normal limits. Appendix appears normal. No evidence of bowel wall thickening, distention, or inflammatory changes. Vascular/Lymphatic: No significant vascular findings are present. No enlarged abdominal or pelvic lymph nodes. Reproductive: Prostate is unremarkable. Other: No abdominal wall hernia or abnormality. No abdominopelvic ascites. Musculoskeletal: No acute or significant osseous findings. IMPRESSION: No acute abnormality identified. No significant interval change from the prior exam is noted. Electronically Signed   By: Inez Catalina M.D.   On: 02/01/2018 12:27     Medical Consultants:    None.  Anti-Infectives:   None  Subjective:    Daniel Skinner he relates he continues to have abdominal pain.  Objective:    Vitals:   02/01/18 0601 02/01/18 1247  02/01/18 2010 02/02/18 0627  BP:  (!) 149/86 138/84 124/86  Pulse:  (!) 57 62 (!) 49  Resp:   18 20  Temp:  98 F (36.7 C) 98.4 F (36.9 C) 98 F (36.7 C)  TempSrc:  Oral Oral Oral  SpO2:  100% 100% 99%  Weight: 70.3 kg (155 lb)     Height: 5' 8"  (1.727  m)       Intake/Output Summary (Last 24 hours) at 02/02/2018 0819 Last data filed at 02/02/2018 0200 Gross per 24 hour  Intake 1696.06 ml  Output 450 ml  Net 1246.06 ml   Filed Weights   02/01/18 0601  Weight: 70.3 kg (155 lb)    Exam: General exam: In no acute distress. Respiratory system: Good air movement and clear to auscultation. Cardiovascular system: S1 & S2 heard, RRR. No JVD. Gastrointestinal system: Abdomen is nondistended, soft and nontender.  Central nervous system: Alert and oriented. No focal neurological deficits. Extremities: No pedal edema. Skin: No rashes, lesions or ulcers Psychiatry: Judgement and insight appear normal. Mood & affect appropriate.    Data Reviewed:    Labs: Basic Metabolic Panel: Recent Labs  Lab 01/28/18 0502 01/31/18 0313 01/31/18 1800 02/01/18 0518  NA 140 142 139 142  K 3.6 3.9 3.9 4.5  CL 103 103 104 107  CO2 28 31 24 25   GLUCOSE 119* 105* 106* 101*  BUN 15 12 10 12   CREATININE 1.13 1.14 1.09 1.10  CALCIUM 10.3 9.3 9.8 9.3   GFR Estimated Creatinine Clearance: 86.4 mL/min (by C-G formula based on SCr of 1.1 mg/dL). Liver Function Tests: Recent Labs  Lab 01/28/18 0502 01/31/18 0313 01/31/18 1800 02/01/18 0518  AST 23 25 27 23   ALT 19 27 29 25   ALKPHOS 35* 33* 37* 36*  BILITOT 0.7 0.6 0.9 1.0  PROT 7.4 7.7 7.7 7.0  ALBUMIN 4.6 4.8 4.9 4.4   Recent Labs  Lab 01/28/18 0502 01/31/18 0313 01/31/18 1800  LIPASE 37 35 42   No results for input(s): AMMONIA in the last 168 hours. Coagulation profile No results for input(s): INR, PROTIME in the last 168 hours.  CBC: Recent Labs  Lab 01/28/18 0502 01/31/18 0313 01/31/18 1800 02/01/18 0518  WBC 6.8 4.8 10.4 8.9  HGB 15.7 15.9 17.1* 16.7  HCT 45.5 45.4 49.1 48.4  MCV 87.8 87.1 87.2 88.0  PLT 282 271 296 306   Cardiac Enzymes: No results for input(s): CKTOTAL, CKMB, CKMBINDEX, TROPONINI in the last 168 hours. BNP (last 3 results) No results for input(s):  PROBNP in the last 8760 hours. CBG: No results for input(s): GLUCAP in the last 168 hours. D-Dimer: Recent Labs    01/31/18 1834  DDIMER <0.27   Hgb A1c: No results for input(s): HGBA1C in the last 72 hours. Lipid Profile: No results for input(s): CHOL, HDL, LDLCALC, TRIG, CHOLHDL, LDLDIRECT in the last 72 hours. Thyroid function studies: No results for input(s): TSH, T4TOTAL, T3FREE, THYROIDAB in the last 72 hours.  Invalid input(s): FREET3 Anemia work up: Recent Labs    02/01/18 0958  VITAMINB12 424  FOLATE 17.2  FERRITIN 252  TIBC 497*  IRON 107  RETICCTPCT 1.0   Sepsis Labs: Recent Labs  Lab 01/28/18 0502 01/31/18 0313 01/31/18 1800 01/31/18 2330 02/01/18 0518  WBC 6.8 4.8 10.4  --  8.9  LATICACIDVEN  --   --   --  0.8  --    Microbiology No results found for this or any previous visit (from the  past 240 hour(s)).   Medications:   . enoxaparin (LOVENOX) injection  40 mg Subcutaneous QHS  . pantoprazole  40 mg Oral BID AC  . sucralfate  1 g Oral TID WC & HS   Continuous Infusions: . sodium chloride Stopped (02/01/18 1247)  . sodium chloride 75 mL/hr at 02/02/18 0200     LOS: 0 days   Charlynne Cousins  [facility] Pager [phone]  *Please refer to [email or web address], password [email or web address] to get updated schedule on who will round on this patient, as hospitalists switch teams weekly. If 7PM-7AM, please contact night-coverage at [email or web address], password [email or web address] for any overnight needs.  02/02/2018, 8:19 AM

## 2018-02-02 NOTE — Progress Notes (Signed)
GASTROENTEROLOGY PROGRESS NOTE  Problem:   Severe, recurrent abdominal pain of approximately 1 week's duration, cause undetermined.  Family history of celiac disease and acalculous gallbladder disease.   Subjective: Doing about the same as yesterday.  His pain gradually escalated during the night and he did need narcotic administration, including IV Dilaudid for breakthrough pain beyond the oral morphine.  However, his pain only got up to about a level of 7, as opposed to "20" out of 10 when he arrived at the hospital Friday evening.  Otherwise, no additional developments.  Objective: The patient is slightly somnolent, probably from his pain medication.  His girlfriend from [place], Mardella LaymanLindsey, who is an emergency room physician, is at the bedside and had many questions and suggestions.  The patient's sed rate has come back at 1.  His urine drug screen the morning after admission was positive for barbiturates, the reason for which is unclear.  It was also positive for opiates as expected since he received a lot of morphine in the emergency room.  To my knowledge, the patient has not been dosed with any phenobarbital-containing compounds, such as Donnatal, during his previous several ER visits but that would have to be researched more extensively.,  Tissue transglutaminase, serum IgA level, and CRP levels are all pending his duodenal and gastric biopsies from his endoscopy on the evening of admission.  Assessment: I remain very unclear as to the etiology of his symptoms, and told the patient and his girlfriend so.  We are wondering if it could be some form of intestinal spasms, but if so, the question is why it would start occurring at this stage in life.  Atypical gallbladder disease or a very pronounced presentation of celiac disease could conceivably (but atypically) present in this fashion.  The hospitalist even broach the idea of psychogenic pain, although again that would seem to be unusual at  this stage in the patient's life.  Plan: 1.  Await outstanding pathology and lab results 2.  Trial of antispasmodics today (dicyclomine for a couple of doses, plus prn sublingual hyoscyamine). 3.  CCK stimulated hepatobiliary scan tomorrow.  I have advised the patient to avoid, to the degree possible, use of either anticholinergics or opiates after roughly midnight tonight, so as to not interfere with that test pharmacologically.  Florencia Reasonsobert V. Donnie Panik, M.D. 02/02/2018 1:41 PM  Pager [phone] If no answer or after 5 PM call [phone]

## 2018-02-03 ENCOUNTER — Encounter (HOSPITAL_COMMUNITY): Payer: Self-pay | Admitting: General Surgery

## 2018-02-03 ENCOUNTER — Inpatient Hospital Stay (HOSPITAL_COMMUNITY): Payer: 59

## 2018-02-03 LAB — IGA: IgA: 160 mg/dL (ref 90–386)

## 2018-02-03 LAB — TISSUE TRANSGLUTAMINASE, IGA

## 2018-02-03 MED ORDER — KETOROLAC TROMETHAMINE 30 MG/ML IJ SOLN
30.0000 mg | Freq: Once | INTRAMUSCULAR | Status: AC
  Administered 2018-02-03: 30 mg via INTRAVENOUS
  Filled 2018-02-03: qty 1

## 2018-02-03 MED ORDER — CIPROFLOXACIN IN D5W 400 MG/200ML IV SOLN
400.0000 mg | INTRAVENOUS | Status: AC
  Administered 2018-02-04: 400 mg via INTRAVENOUS
  Filled 2018-02-03: qty 200

## 2018-02-03 MED ORDER — CHLORHEXIDINE GLUCONATE CLOTH 2 % EX PADS
6.0000 | MEDICATED_PAD | Freq: Once | CUTANEOUS | Status: AC
  Administered 2018-02-03: 6 via TOPICAL

## 2018-02-03 MED ORDER — TECHNETIUM TC 99M MEBROFENIN IV KIT
5.4000 | PACK | Freq: Once | INTRAVENOUS | Status: AC | PRN
  Administered 2018-02-03: 5.4 via INTRAVENOUS

## 2018-02-03 MED ORDER — KETOROLAC TROMETHAMINE 30 MG/ML IJ SOLN: 30.0000 mg | Freq: Four times a day (QID) | INTRAMUSCULAR | Status: DC | PRN

## 2018-02-03 MED ORDER — CHLORHEXIDINE GLUCONATE CLOTH 2 % EX PADS
6.0000 | MEDICATED_PAD | Freq: Once | CUTANEOUS | Status: AC
  Administered 2018-02-04: 6 via TOPICAL

## 2018-02-03 NOTE — Progress Notes (Addendum)
Surgery:  (Full note to follow)  Healthy gentleman.  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 1 week history of intermittent attacks of severe epigastric pain.  Perhaps a little more in the right upper quadrant.  No back pain or shoulder pain.  Has been to the ER twice.  Had some vomiting more recently.  No prior intra-abdominal surgery.    2 of his immediate family members have had cholecystectomy  Ultrasound normal.  CT scan unrevealing.  Liver function test normal.  Upper endoscopy negative.  Hepatobiliary scan shows decreased ejection fraction and symptoms were produced by fatty meal.  On exam he is alert and appropriate and in minimal distress Abdomen is soft.  Nondistended.  Subjectively a little tender in the right upper quadrant.  No mass.  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  He is aware of the risk of bleeding, infection, conversion to open laparotomy, port site hernia, bile leak, injury to adjacent organs with major reconstructive surgery.  He is aware that there may be alternative diagnoses were that his symptoms may not resolve.  He understands all of these issues.  All of his questions were answered.  He agrees with this plan   Angelia MouldHaywood M. Derrell LollingIngram, M.D., [facility] General and Minimally invasive Surgery Breast and Colorectal Surgery Office:   [phone] Pager:   [phone] .

## 2018-02-03 NOTE — Progress Notes (Signed)
Patient back from HIDA scan.  Patient states pain is 2/10.  He is comfortable at this time with family at bedside.  Will continue to monitor patient.

## 2018-02-03 NOTE — Progress Notes (Signed)
[facility] Progress Note  Gillermo MurdochJeremy L Streater 42 y.o. [birth year]   Subjective: Recurrent abdominal pain overnight with minimal relief from antispasmodics (he is unsure if one of the antispasmodics helped better). Denies N/V. Girlfriend and father at bedside.  Objective: Vital signs: Vitals:   02/02/18 2039 02/03/18 0544  BP: (!) 137/91 133/75  Pulse: (!) 53 (!) 54  Resp: 18 18  Temp: 98.5 F (36.9 C) 98.2 F (36.8 C)  SpO2: 100% 100%    Physical Exam: Gen: alert, mild acute distress, pleasant, multiple tattoos HEENT: anicteric sclera CV: RRR Chest: CTA B Abd: diffuse tenderness with guarding to light palpation, no rebound tenderness (verbalized worsened pain on rebound without guarding), soft, nondistended, +BS Ext: no edema  Lab Results: Recent Labs    01/31/18 1800 02/01/18 0518  NA 139 142  K 3.9 4.5  CL 104 107  CO2 24 25  GLUCOSE 106* 101*  BUN 10 12  CREATININE 1.09 1.10  CALCIUM 9.8 9.3   Recent Labs    01/31/18 1800 02/01/18 0518  AST 27 23  ALT 29 25  ALKPHOS 37* 36*  BILITOT 0.9 1.0  PROT 7.7 7.0  ALBUMIN 4.9 4.4   Recent Labs    01/31/18 1800 02/01/18 0518  WBC 10.4 8.9  HGB 17.1* 16.7  HCT 49.1 48.4  MCV 87.2 88.0  PLT 296 306      Assessment/Plan: Chronic diffuse pain (worse in epigastric) with negative inflammatory markers. Doubt vasculitis. ANA pending. No intra-abdominal source found. HIDA scan today. Doubt spastic colon with acute onset. Question MSK (although pain is out of proportion to what can be seen with a muscle strain) vs psychogenic pain. If HIDA scan is negative, then treat symtomatically and see if pain improves. He reportedly was doing exercises with his children recently that involved straining his abdominal muscles and girlfriend questions whether he tore an abdominal muscle. If all tests are negative then may need an abdominal MRI in the future to assess abdominal muscles but would hold off of that now and  manage symptoms.   Sandie Swayze C. 02/03/2018, 10:35 AM  Questions please call [phone] ID: Gillermo MurdochJeremy L Speagle, male   DOB: [birth year], 42 y.o.   MRN: [id number]

## 2018-02-03 NOTE — Progress Notes (Signed)
Patient c/o moderate abdominal pain.  Dr. Radonna RickerFeliz informed; orders received for toradol.  Unable to administer any pain medication at this time per Nuc Med request in order to appropriately conduct HIDA scan this AM.  Will administer patient's pain medication when he returns from scan.  Hot pack given to patient and patient repositioned.  Patient in NAD at this time and resting in bed.  Will continue to monitor.

## 2018-02-03 NOTE — Progress Notes (Deleted)
[facility] PROGRESS NOTE    Progress Note  Daniel Skinner  ZOX:[id number] DOB: 01/28/1976 DOA: 8/2/201Gillermo Murdoch9 PCP: Patient, No Pcp Per     Brief Narrative:   Daniel MurdochJeremy L Skinner is an 42 y.o. male without a significant past medical history who presented with intermittent epigastric pain over the last 4 days from the navel to the sternum radiates to the right quadrant, CT scan of the abdomen and pelvis was done  Assessment/Plan:   Principal Problem:   Recurrent abdominal pain Appreciate GIs assistance patient getting a HIDA scan today. UDS was positive for barbiturates which she does not have a prescription.  DVT prophylaxis: lovenox Family Communication:mother and father Disposition Plan/Barrier to D/C: home today Code Status:     Code Status Orders  (From admission, onward)        Start     Ordered   01/31/18 2254  Full code  Continuous     01/31/18 2255    Code Status History    This patient has a current code status but no historical code status.        IV Access:    Peripheral IV   Procedures and diagnostic studies:   Ct Abdomen Pelvis W Contrast  Result Date: 02/01/2018 CLINICAL DATA:  Epigastric pain for several days EXAM: CT ABDOMEN AND PELVIS WITH CONTRAST TECHNIQUE: Multidetector CT imaging of the abdomen and pelvis was performed using the standard protocol following bolus administration of intravenous contrast. CONTRAST:  100 mL Isovue-300 COMPARISON:  01/28/2018 FINDINGS: Lower chest: No acute abnormality. Hepatobiliary: No focal liver abnormality is seen. Area of focal fatty infiltration is identified adjacent to the falciform ligament. No gallstones, gallbladder wall thickening, or biliary dilatation. Pancreas: Unremarkable. No pancreatic ductal dilatation or surrounding inflammatory changes. Spleen: Normal in size without focal abnormality. Adrenals/Urinary Tract: Adrenal glands are unremarkable. Kidneys are normal, without renal calculi, focal  lesion, or hydronephrosis. Bladder is unremarkable. Stomach/Bowel: Stomach is within normal limits. Appendix appears normal. No evidence of bowel wall thickening, distention, or inflammatory changes. Vascular/Lymphatic: No significant vascular findings are present. No enlarged abdominal or pelvic lymph nodes. Reproductive: Prostate is unremarkable. Other: No abdominal wall hernia or abnormality. No abdominopelvic ascites. Musculoskeletal: No acute or significant osseous findings. IMPRESSION: No acute abnormality identified. No significant interval change from the prior exam is noted. Electronically Signed   By: Alcide CleverMark  Lukens M.D.   On: 02/01/2018 12:27     Medical Consultants:    None.  Anti-Infectives:   None  Subjective:    Daniel MurdochJeremy L Skinner he relates he continues to have abdominal pain. Toradol working for the pain  Objective:    Vitals:   02/02/18 0627 02/02/18 1348 02/02/18 2039 02/03/18 0544  BP: 124/86 132/78 (!) 137/91 133/75  Pulse: (!) 49 (!) 57 (!) 53 (!) 54  Resp: 20 20 18 18   Temp: 98 F (36.7 C) 98.3 F (36.8 C) 98.5 F (36.9 C) 98.2 F (36.8 C)  TempSrc: Oral Oral Oral Oral  SpO2: 99% 100% 100% 100%  Weight:      Height:        Intake/Output Summary (Last 24 hours) at 02/03/2018 1016 Last data filed at 02/03/2018 0919 Gross per 24 hour  Intake 2993.31 ml  Output -  Net 2993.31 ml   Filed Weights   02/01/18 0601  Weight: 70.3 kg (155 lb)    Exam: General exam: In no acute distress. Respiratory system: Good air movement and clear to auscultation. Cardiovascular system: S1 & S2  heard, RRR. No JVD. Gastrointestinal system: Abdomen is nondistended, soft and nontender.  Central nervous system: Alert and oriented. No focal neurological deficits. Extremities: No pedal edema. Skin: No rashes, lesions or ulcers Psychiatry: Judgement and insight appear normal. Mood & affect appropriate.    Data Reviewed:    Labs: Basic Metabolic Panel: Recent Labs  Lab  01/28/18 0502 01/31/18 0313 01/31/18 1800 02/01/18 0518  NA 140 142 139 142  K 3.6 3.9 3.9 4.5  CL 103 103 104 107  CO2 28 31 24 25   GLUCOSE 119* 105* 106* 101*  BUN 15 12 10 12   CREATININE 1.13 1.14 1.09 1.10  CALCIUM 10.3 9.3 9.8 9.3   GFR Estimated Creatinine Clearance: 86.4 mL/min (by C-G formula based on SCr of 1.1 mg/dL). Liver Function Tests: Recent Labs  Lab 01/28/18 0502 01/31/18 0313 01/31/18 1800 02/01/18 0518  AST 23 25 27 23   ALT 19 27 29 25   ALKPHOS 35* 33* 37* 36*  BILITOT 0.7 0.6 0.9 1.0  PROT 7.4 7.7 7.7 7.0  ALBUMIN 4.6 4.8 4.9 4.4   Recent Labs  Lab 01/28/18 0502 01/31/18 0313 01/31/18 1800  LIPASE 37 35 42   No results for input(s): AMMONIA in the last 168 hours. Coagulation profile No results for input(s): INR, PROTIME in the last 168 hours.  CBC: Recent Labs  Lab 01/28/18 0502 01/31/18 0313 01/31/18 1800 02/01/18 0518  WBC 6.8 4.8 10.4 8.9  HGB 15.7 15.9 17.1* 16.7  HCT 45.5 45.4 49.1 48.4  MCV 87.8 87.1 87.2 88.0  PLT 282 271 296 306   Cardiac Enzymes: No results for input(s): CKTOTAL, CKMB, CKMBINDEX, TROPONINI in the last 168 hours. BNP (last 3 results) No results for input(s): PROBNP in the last 8760 hours. CBG: No results for input(s): GLUCAP in the last 168 hours. D-Dimer: Recent Labs    01/31/18 1834  DDIMER <0.27   Hgb A1c: No results for input(s): HGBA1C in the last 72 hours. Lipid Profile: No results for input(s): CHOL, HDL, LDLCALC, TRIG, CHOLHDL, LDLDIRECT in the last 72 hours. Thyroid function studies: No results for input(s): TSH, T4TOTAL, T3FREE, THYROIDAB in the last 72 hours.  Invalid input(s): FREET3 Anemia work up: Recent Labs    02/01/18 0958  VITAMINB12 424  FOLATE 17.2  FERRITIN 252  TIBC 497*  IRON 107  RETICCTPCT 1.0   Sepsis Labs: Recent Labs  Lab 01/28/18 0502 01/31/18 0313 01/31/18 1800 01/31/18 2330 02/01/18 0518  WBC 6.8 4.8 10.4  --  8.9  LATICACIDVEN  --   --   --  0.8   --    Microbiology No results found for this or any previous visit (from the past 240 hour(s)).   Medications:   . enoxaparin (LOVENOX) injection  40 mg Subcutaneous QHS  . pantoprazole  40 mg Oral BID AC  . sucralfate  1 g Oral TID WC & HS   Continuous Infusions: . sodium chloride Stopped (02/01/18 1247)  . sodium chloride Stopped (02/03/18 1013)     LOS: 1 day   Marinda Elk  [facility] Pager [phone]  *Please refer to [email or web address], password [email or web address] to get updated schedule on who will round on this patient, as hospitalists switch teams weekly. If 7PM-7AM, please contact night-coverage at [email or web address], password [email or web address] for any overnight needs.  02/03/2018, 10:16 AM

## 2018-02-03 NOTE — Progress Notes (Signed)
Patient complaining of severe abdominal pain but not supposed to have opiates/narcotics since having HIDA scan done this morning. Linton FlemingsX. Blount NP paged. New orders received. Will continue to monitor patient.

## 2018-02-03 NOTE — Progress Notes (Signed)
[facility] PROGRESS NOTE    Progress Note  Daniel Skinner  FBP:[id number] DOB: 06-Apr-1976 DOA: 01/31/2018 PCP: Patient, No Pcp Per     Brief Narrative:   Daniel Skinner is an 42 y.o. male without a significant past medical history who presented with intermittent epigastric pain over the last 4 days from the navel to the sternum radiates to the right quadrant, CT scan of the abdomen and pelvis was done  Assessment/Plan:   Principal Problem:   Recurrent abdominal pain GI on board the recommended HIDA scan will also get immunologic panel for celiac disease and an ESR is 1. UDS was positive for barbiturates. His pain has been controlled with Toradol.  DVT prophylaxis: lovenox Family Communication:mother and father Disposition Plan/Barrier to D/C: none Code Status:     Code Status Orders  (From admission, onward)        Start     Ordered   01/31/18 2254  Full code  Continuous     01/31/18 2255    Code Status History    This patient has a current code status but no historical code status.        IV Access:    Peripheral IV   Procedures and diagnostic studies:   Ct Abdomen Pelvis W Contrast  Result Date: 02/01/2018 CLINICAL DATA:  Epigastric pain for several days EXAM: CT ABDOMEN AND PELVIS WITH CONTRAST TECHNIQUE: Multidetector CT imaging of the abdomen and pelvis was performed using the standard protocol following bolus administration of intravenous contrast. CONTRAST:  100 mL Isovue-300 COMPARISON:  01/28/2018 FINDINGS: Lower chest: No acute abnormality. Hepatobiliary: No focal liver abnormality is seen. Area of focal fatty infiltration is identified adjacent to the falciform ligament. No gallstones, gallbladder wall thickening, or biliary dilatation. Pancreas: Unremarkable. No pancreatic ductal dilatation or surrounding inflammatory changes. Spleen: Normal in size without focal abnormality. Adrenals/Urinary Tract: Adrenal glands are unremarkable. Kidneys are  normal, without renal calculi, focal lesion, or hydronephrosis. Bladder is unremarkable. Stomach/Bowel: Stomach is within normal limits. Appendix appears normal. No evidence of bowel wall thickening, distention, or inflammatory changes. Vascular/Lymphatic: No significant vascular findings are present. No enlarged abdominal or pelvic lymph nodes. Reproductive: Prostate is unremarkable. Other: No abdominal wall hernia or abnormality. No abdominopelvic ascites. Musculoskeletal: No acute or significant osseous findings. IMPRESSION: No acute abnormality identified. No significant interval change from the prior exam is noted. Electronically Signed   By: Inez Catalina M.D.   On: 02/01/2018 12:27     Medical Consultants:    None.  Anti-Infectives:   None  Subjective:    Daniel Skinner he relates he continues to have abdominal pain.  Objective:    Vitals:   02/02/18 0627 02/02/18 1348 02/02/18 2039 02/03/18 0544  BP: 124/86 132/78 (!) 137/91 133/75  Pulse: (!) 49 (!) 57 (!) 53 (!) 54  Resp: 20 20 18 18   Temp: 98 F (36.7 C) 98.3 F (36.8 C) 98.5 F (36.9 C) 98.2 F (36.8 C)  TempSrc: Oral Oral Oral Oral  SpO2: 99% 100% 100% 100%  Weight:      Height:        Intake/Output Summary (Last 24 hours) at 02/03/2018 0846 Last data filed at 02/03/2018 0600 Gross per 24 hour  Intake 2993.31 ml  Output -  Net 2993.31 ml   Filed Weights   02/01/18 0601  Weight: 70.3 kg (155 lb)    Exam: General exam: In no acute distress. Respiratory system: Good air movement and clear to auscultation.  Cardiovascular system: S1 & S2 heard, RRR. No JVD. Gastrointestinal system: Abdomen is nondistended, soft and nontender.  Central nervous system: Alert and oriented. No focal neurological deficits. Extremities: No pedal edema. Skin: No rashes, lesions or ulcers Psychiatry: Judgement and insight appear normal. Mood & affect appropriate.    Data Reviewed:    Labs: Basic Metabolic Panel: Recent  Labs  Lab 01/28/18 0502 01/31/18 0313 01/31/18 1800 02/01/18 0518  NA 140 142 139 142  K 3.6 3.9 3.9 4.5  CL 103 103 104 107  CO2 28 31 24 25   GLUCOSE 119* 105* 106* 101*  BUN 15 12 10 12   CREATININE 1.13 1.14 1.09 1.10  CALCIUM 10.3 9.3 9.8 9.3   GFR Estimated Creatinine Clearance: 86.4 mL/min (by C-G formula based on SCr of 1.1 mg/dL). Liver Function Tests: Recent Labs  Lab 01/28/18 0502 01/31/18 0313 01/31/18 1800 02/01/18 0518  AST 23 25 27 23   ALT 19 27 29 25   ALKPHOS 35* 33* 37* 36*  BILITOT 0.7 0.6 0.9 1.0  PROT 7.4 7.7 7.7 7.0  ALBUMIN 4.6 4.8 4.9 4.4   Recent Labs  Lab 01/28/18 0502 01/31/18 0313 01/31/18 1800  LIPASE 37 35 42   No results for input(s): AMMONIA in the last 168 hours. Coagulation profile No results for input(s): INR, PROTIME in the last 168 hours.  CBC: Recent Labs  Lab 01/28/18 0502 01/31/18 0313 01/31/18 1800 02/01/18 0518  WBC 6.8 4.8 10.4 8.9  HGB 15.7 15.9 17.1* 16.7  HCT 45.5 45.4 49.1 48.4  MCV 87.8 87.1 87.2 88.0  PLT 282 271 296 306   Cardiac Enzymes: No results for input(s): CKTOTAL, CKMB, CKMBINDEX, TROPONINI in the last 168 hours. BNP (last 3 results) No results for input(s): PROBNP in the last 8760 hours. CBG: No results for input(s): GLUCAP in the last 168 hours. D-Dimer: Recent Labs    01/31/18 1834  DDIMER <0.27   Hgb A1c: No results for input(s): HGBA1C in the last 72 hours. Lipid Profile: No results for input(s): CHOL, HDL, LDLCALC, TRIG, CHOLHDL, LDLDIRECT in the last 72 hours. Thyroid function studies: No results for input(s): TSH, T4TOTAL, T3FREE, THYROIDAB in the last 72 hours.  Invalid input(s): FREET3 Anemia work up: Recent Labs    02/01/18 0958  VITAMINB12 424  FOLATE 17.2  FERRITIN 252  TIBC 497*  IRON 107  RETICCTPCT 1.0   Sepsis Labs: Recent Labs  Lab 01/28/18 0502 01/31/18 0313 01/31/18 1800 01/31/18 2330 02/01/18 0518  WBC 6.8 4.8 10.4  --  8.9  LATICACIDVEN  --   --    --  0.8  --    Microbiology No results found for this or any previous visit (from the past 240 hour(s)).   Medications:   . enoxaparin (LOVENOX) injection  40 mg Subcutaneous QHS  . pantoprazole  40 mg Oral BID AC  . polyethylene glycol  17 g Oral BID  . sucralfate  1 g Oral TID WC & HS   Continuous Infusions: . sodium chloride Stopped (02/01/18 1247)  . sodium chloride 75 mL/hr at 02/03/18 0600     LOS: 1 day   Charlynne Cousins  [facility] Pager [phone]  *Please refer to [email or web address], password [email or web address] to get updated schedule on who will round on this patient, as hospitalists switch teams weekly. If 7PM-7AM, please contact night-coverage at [email or web address], password [email or web address] for any overnight needs.  02/03/2018, 8:46 AM
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
## 2018-02-04 ENCOUNTER — Inpatient Hospital Stay (HOSPITAL_COMMUNITY): Payer: 59 | Admitting: Anesthesiology

## 2018-02-04 ENCOUNTER — Encounter (HOSPITAL_COMMUNITY)
Admission: EM | Disposition: A | Discharge status: Home / Self Care | Payer: Self-pay | Source: Home / Self Care | Attending: Internal Medicine

## 2018-02-04 DIAGNOSIS — K819 Cholecystitis, unspecified: Secondary | ICD-10-CM

## 2018-02-04 HISTORY — PX: CHOLECYSTECTOMY: SHX55

## 2018-02-04 LAB — SURGICAL PCR SCREEN
MRSA, PCR: NEGATIVE
STAPHYLOCOCCUS AUREUS: NEGATIVE

## 2018-02-04 LAB — ANA: ANA: NEGATIVE

## 2018-02-04 SURGERY — LAPAROSCOPIC CHOLECYSTECTOMY WITH INTRAOPERATIVE CHOLANGIOGRAM
Anesthesia: General

## 2018-02-04 MED ORDER — BUPIVACAINE-EPINEPHRINE (PF) 0.5% -1:200000 IJ SOLN
INTRAMUSCULAR | Status: AC
  Filled 2018-02-04: qty 30

## 2018-02-04 MED ORDER — ONDANSETRON HCL 4 MG/2ML IJ SOLN
INTRAMUSCULAR | Status: AC
Start: 2018-02-04 — End: ?
  Filled 2018-02-04: qty 2

## 2018-02-04 MED ORDER — DEXAMETHASONE SODIUM PHOSPHATE 10 MG/ML IJ SOLN
INTRAMUSCULAR | Status: DC | PRN
  Administered 2018-02-04: 10 mg via INTRAVENOUS

## 2018-02-04 MED ORDER — BUPIVACAINE-EPINEPHRINE 0.5% -1:200000 IJ SOLN
INTRAMUSCULAR | Status: DC | PRN
  Administered 2018-02-04: 30 mL

## 2018-02-04 MED ORDER — CIPROFLOXACIN IN D5W 400 MG/200ML IV SOLN
INTRAVENOUS | Status: AC
  Filled 2018-02-04: qty 200

## 2018-02-04 MED ORDER — TRAMADOL HCL 50 MG PO TABS
50.0000 mg | ORAL_TABLET | Freq: Four times a day (QID) | ORAL | Status: DC | PRN
Start: 2018-02-04 — End: 2018-02-04

## 2018-02-04 MED ORDER — SUGAMMADEX SODIUM 200 MG/2ML IV SOLN
INTRAVENOUS | Status: DC | PRN
  Administered 2018-02-04: 200 mg via INTRAVENOUS

## 2018-02-04 MED ORDER — MIDAZOLAM HCL 2 MG/2ML IJ SOLN
INTRAMUSCULAR | Status: DC | PRN
  Administered 2018-02-04: 2 mg via INTRAVENOUS

## 2018-02-04 MED ORDER — SUGAMMADEX SODIUM 200 MG/2ML IV SOLN
INTRAVENOUS | Status: AC
  Filled 2018-02-04: qty 2

## 2018-02-04 MED ORDER — FENTANYL CITRATE (PF) 100 MCG/2ML IJ SOLN
INTRAMUSCULAR | Status: AC
  Filled 2018-02-04: qty 2

## 2018-02-04 MED ORDER — ROCURONIUM BROMIDE 10 MG/ML (PF) SYRINGE
PREFILLED_SYRINGE | INTRAVENOUS | Status: AC
  Filled 2018-02-04: qty 10

## 2018-02-04 MED ORDER — HYDROMORPHONE HCL 1 MG/ML IJ SOLN
0.2500 mg | INTRAMUSCULAR | Status: DC | PRN
  Administered 2018-02-04 (×2): 0.5 mg via INTRAVENOUS

## 2018-02-04 MED ORDER — LIDOCAINE 2% (20 MG/ML) 5 ML SYRINGE
INTRAMUSCULAR | Status: AC
  Filled 2018-02-04: qty 5

## 2018-02-04 MED ORDER — KETOROLAC TROMETHAMINE 30 MG/ML IJ SOLN
30.0000 mg | Freq: Four times a day (QID) | INTRAMUSCULAR | Status: DC | PRN
  Administered 2018-02-04: 30 mg via INTRAVENOUS
  Filled 2018-02-04: qty 1

## 2018-02-04 MED ORDER — HYDROMORPHONE HCL 1 MG/ML IJ SOLN
INTRAMUSCULAR | Status: AC
  Administered 2018-02-04: 0.5 mg via INTRAVENOUS
  Filled 2018-02-04: qty 1

## 2018-02-04 MED ORDER — CELECOXIB 200 MG PO CAPS
200.0000 mg | ORAL_CAPSULE | ORAL | Status: AC
  Administered 2018-02-04: 200 mg via ORAL
  Filled 2018-02-04 (×3): qty 1

## 2018-02-04 MED ORDER — LABETALOL HCL 5 MG/ML IV SOLN
INTRAVENOUS | Status: AC
  Filled 2018-02-04: qty 4

## 2018-02-04 MED ORDER — LACTATED RINGERS IR SOLN
Status: DC | PRN
  Administered 2018-02-04: 1000 mL

## 2018-02-04 MED ORDER — TRAMADOL HCL 50 MG PO TABS: 50.0000 mg | ORAL_TABLET | Freq: Four times a day (QID) | ORAL | 0 refills | Status: AC | PRN

## 2018-02-04 MED ORDER — SUCCINYLCHOLINE CHLORIDE 200 MG/10ML IV SOSY
PREFILLED_SYRINGE | INTRAVENOUS | Status: AC
  Filled 2018-02-04: qty 10

## 2018-02-04 MED ORDER — IBUPROFEN 200 MG PO TABS: 600.0000 mg | ORAL_TABLET | Freq: Three times a day (TID) | ORAL | 0 refills | Status: DC | PRN

## 2018-02-04 MED ORDER — PROPOFOL 10 MG/ML IV BOLUS
INTRAVENOUS | Status: DC | PRN
  Administered 2018-02-04: 150 mg via INTRAVENOUS

## 2018-02-04 MED ORDER — ACETAMINOPHEN 500 MG PO TABS
1000.0000 mg | ORAL_TABLET | Freq: Four times a day (QID) | ORAL | Status: DC
  Administered 2018-02-04: 1000 mg via ORAL
  Filled 2018-02-04: qty 2

## 2018-02-04 MED ORDER — ACETAMINOPHEN 500 MG PO TABS: 1000.0000 mg | ORAL_TABLET | Freq: Four times a day (QID) | ORAL | 0 refills | Status: AC | PRN

## 2018-02-04 MED ORDER — ONDANSETRON HCL 4 MG/2ML IJ SOLN
INTRAMUSCULAR | Status: DC | PRN
  Administered 2018-02-04: 4 mg via INTRAVENOUS

## 2018-02-04 MED ORDER — ACETAMINOPHEN 500 MG PO TABS
1000.0000 mg | ORAL_TABLET | ORAL | Status: AC
  Administered 2018-02-04: 1000 mg via ORAL
  Filled 2018-02-04: qty 2

## 2018-02-04 MED ORDER — ENOXAPARIN SODIUM 40 MG/0.4ML ~~LOC~~ SOLN
40.0000 mg | Freq: Every day | SUBCUTANEOUS | Status: DC
Start: 2018-02-05 — End: 2018-02-04

## 2018-02-04 MED ORDER — FENTANYL CITRATE (PF) 100 MCG/2ML IJ SOLN
INTRAMUSCULAR | Status: DC | PRN
  Administered 2018-02-04: 100 ug via INTRAVENOUS

## 2018-02-04 MED ORDER — OXYCODONE HCL 5 MG PO TABS: 5.0000 mg | ORAL_TABLET | ORAL | Status: DC | PRN

## 2018-02-04 MED ORDER — MIDAZOLAM HCL 2 MG/2ML IJ SOLN
INTRAMUSCULAR | Status: AC
  Filled 2018-02-04: qty 2

## 2018-02-04 MED ORDER — 0.9 % SODIUM CHLORIDE (POUR BTL) OPTIME
TOPICAL | Status: DC | PRN
  Administered 2018-02-04: 1000 mL

## 2018-02-04 MED ORDER — VANCOMYCIN HCL IN DEXTROSE 1-5 GM/200ML-% IV SOLN
INTRAVENOUS | Status: AC
  Administered 2018-02-04: 1000 mg via INTRAVENOUS
  Filled 2018-02-04: qty 200

## 2018-02-04 MED ORDER — VANCOMYCIN HCL IN DEXTROSE 1-5 GM/200ML-% IV SOLN
1000.0000 mg | INTRAVENOUS | Status: AC
  Administered 2018-02-04: 1000 mg via INTRAVENOUS
  Filled 2018-02-04: qty 200

## 2018-02-04 MED ORDER — ROCURONIUM BROMIDE 10 MG/ML (PF) SYRINGE
PREFILLED_SYRINGE | INTRAVENOUS | Status: DC | PRN
  Administered 2018-02-04: 50 mg via INTRAVENOUS

## 2018-02-04 MED ORDER — IOPAMIDOL (ISOVUE-300) INJECTION 61%
INTRAVENOUS | Status: AC
  Filled 2018-02-04: qty 50

## 2018-02-04 MED ORDER — LACTATED RINGERS IV SOLN
INTRAVENOUS | Status: DC
  Administered 2018-02-04: 12:00:00 via INTRAVENOUS

## 2018-02-04 MED ORDER — PROPOFOL 10 MG/ML IV BOLUS
INTRAVENOUS | Status: AC
  Filled 2018-02-04: qty 20

## 2018-02-04 MED ORDER — LIDOCAINE 2% (20 MG/ML) 5 ML SYRINGE
INTRAMUSCULAR | Status: DC | PRN
  Administered 2018-02-04: 100 mg via INTRAVENOUS

## 2018-02-04 MED ORDER — GABAPENTIN 300 MG PO CAPS
300.0000 mg | ORAL_CAPSULE | ORAL | Status: AC
  Administered 2018-02-04: 300 mg via ORAL
  Filled 2018-02-04: qty 1

## 2018-02-04 MED ORDER — DEXAMETHASONE SODIUM PHOSPHATE 10 MG/ML IJ SOLN
INTRAMUSCULAR | Status: AC
  Filled 2018-02-04: qty 1

## 2018-02-04 MED ORDER — SODIUM CHLORIDE 0.9 % IV SOLN: INTRAVENOUS | Status: DC

## 2018-02-04 MED ORDER — MORPHINE SULFATE (PF) 2 MG/ML IV SOLN: 1.0000 mg | INTRAVENOUS | Status: DC | PRN

## 2018-02-04 SURGICAL SUPPLY — 35 items
ADH SKN CLS APL DERMABOND .7 (GAUZE/BANDAGES/DRESSINGS) ×1
APPLIER CLIP ROT 10 11.4 M/L (STAPLE) ×3
APR CLP MED LRG 11.4X10 (STAPLE) ×1
BAG SPEC RTRVL 10 TROC 200 (ENDOMECHANICALS)
BAG SPEC RTRVL LRG 6X4 10 (ENDOMECHANICALS)
CABLE HIGH FREQUENCY MONO STRZ (ELECTRODE) ×3 IMPLANT
CLIP APPLIE ROT 10 11.4 M/L (STAPLE) ×1 IMPLANT
COVER MAYO STAND STRL (DRAPES) ×1 IMPLANT
COVER SURGICAL LIGHT HANDLE (MISCELLANEOUS) ×3 IMPLANT
DECANTER SPIKE VIAL GLASS SM (MISCELLANEOUS) ×1 IMPLANT
DERMABOND ADVANCED (GAUZE/BANDAGES/DRESSINGS) ×2
DERMABOND ADVANCED .7 DNX12 (GAUZE/BANDAGES/DRESSINGS) ×1 IMPLANT
DRAPE C-ARM 42X120 X-RAY (DRAPES) ×1 IMPLANT
ELECT REM PT RETURN 15FT ADLT (MISCELLANEOUS) ×3 IMPLANT
GLOVE EUDERMIC 7 POWDERFREE (GLOVE) ×3 IMPLANT
GOWN STRL REUS W/TWL XL LVL3 (GOWN DISPOSABLE) ×12 IMPLANT
HEMOSTAT SNOW SURGICEL 2X4 (HEMOSTASIS) IMPLANT
KIT BASIN OR (CUSTOM PROCEDURE TRAY) ×3 IMPLANT
POSITIONER SURGICAL ARM (MISCELLANEOUS) IMPLANT
POUCH RETRIEVAL ECOSAC 10 (ENDOMECHANICALS) IMPLANT
POUCH RETRIEVAL ECOSAC 10MM (ENDOMECHANICALS)
POUCH SPECIMEN RETRIEVAL 10MM (ENDOMECHANICALS) IMPLANT
SCISSORS LAP 5X35 DISP (ENDOMECHANICALS) ×3 IMPLANT
SET CHOLANGIOGRAPH MIX (MISCELLANEOUS) ×1 IMPLANT
SET IRRIG TUBING LAPAROSCOPIC (IRRIGATION / IRRIGATOR) ×3 IMPLANT
SLEEVE XCEL OPT CAN 5 100 (ENDOMECHANICALS) ×3 IMPLANT
SUT MNCRL AB 4-0 PS2 18 (SUTURE) ×3 IMPLANT
TAPE CLOTH 4X10 WHT NS (GAUZE/BANDAGES/DRESSINGS) IMPLANT
TOWEL OR 17X26 10 PK STRL BLUE (TOWEL DISPOSABLE) ×3 IMPLANT
TOWEL OR NON WOVEN STRL DISP B (DISPOSABLE) ×3 IMPLANT
TRAY LAPAROSCOPIC (CUSTOM PROCEDURE TRAY) ×3 IMPLANT
TROCAR BLADELESS OPT 5 100 (ENDOMECHANICALS) ×3 IMPLANT
TROCAR XCEL BLUNT TIP 100MML (ENDOMECHANICALS) ×3 IMPLANT
TROCAR XCEL NON-BLD 11X100MML (ENDOMECHANICALS) ×3 IMPLANT
TUBING INSUF HEATED (TUBING) ×3 IMPLANT

## 2018-02-04 NOTE — Anesthesia Postprocedure Evaluation (Signed)
Anesthesia Post Note  Patient: Daniel MurdochJeremy L Skinner  Procedure(s) Performed: LAPAROSCOPIC CHOLECYSTECTOMY (N/A )     Patient location during evaluation: PACU Anesthesia Type: General Level of consciousness: awake and alert Pain management: pain level controlled Vital Signs Assessment: post-procedure vital signs reviewed and stable Respiratory status: spontaneous breathing, nonlabored ventilation, respiratory function stable and patient connected to nasal cannula oxygen Cardiovascular status: blood pressure returned to baseline and stable Postop Assessment: no apparent nausea or vomiting Anesthetic complications: no    Last Vitals:  Vitals:   02/04/18 1345 02/04/18 1356  BP: 126/86 121/79  Pulse: 81 72  Resp: 20 16  Temp: 37.1 C 36.4 C  SpO2: 99% 100%    Last Pain:  Vitals:   02/04/18 1356  TempSrc: Oral  PainSc:                  [name]

## 2018-02-04 NOTE — H&P (View-Only) (Signed)
Surgery:  Had basically an uneventful night Wanted some clear liquids and tolerated those but said it did increase his pain Has been n.p.o. since midnight Scheduled for laparoscopic cholecystectomy this morning  Chereese Cilento M. Chasin Findling, M.D., FACS [facility] General and Minimally invasive Surgery Breast and Colorectal Surgery Office:   [phone]   

## 2018-02-04 NOTE — Progress Notes (Signed)
Pt brought back to room 1415. Pt AOx4. Pt resting comfortable in bed.

## 2018-02-04 NOTE — Addendum Note (Signed)
Addendum  created 02/04/18 1549 by Florene Routeeardon, Laurann Mcmorris L, CRNA   Attestation recorded in [facility], Intraprocedure Attestations filed

## 2018-02-04 NOTE — Progress Notes (Signed)
Pt discharged from the unit. Discharge instructions were reviewed with pt and family members. No questions or concerns at this time.

## 2018-02-04 NOTE — Interval H&P Note (Signed)
History and Physical Interval Note:  02/04/2018 11:37 AM  Daniel Skinner  has presented today for surgery, with the diagnosis of cholecystitis  The various methods of treatment have been discussed with the patient and family. After consideration of risks, benefits and other options for treatment, the patient has consented to  Procedure(s): LAPAROSCOPIC CHOLECYSTECTOMY WITH POSSIBLE INTRAOPERATIVE CHOLANGIOGRAM (N/A) as a surgical intervention .  The patient's history has been reviewed, patient examined, no change in status, stable for surgery.  I have reviewed the patient's chart and labs.  Questions were answered to the patient's satisfaction.     Ernestene MentionHaywood M Lionardo Haze

## 2018-02-04 NOTE — Anesthesia Procedure Notes (Signed)
Procedure Name: Intubation Date/Time: 02/04/2018 12:12 PM Performed by: Florene Routeeardon, Raney Koeppen L, CRNA Patient Re-evaluated:Patient Re-evaluated prior to induction Oxygen Delivery Method: Circle system utilized Preoxygenation: Pre-oxygenation with 100% oxygen Induction Type: IV induction Ventilation: Mask ventilation without difficulty and Oral airway inserted - appropriate to patient size Laryngoscope Size: Miller and 3 Grade View: Grade I Tube type: Oral Tube size: 7.5 mm Number of attempts: 1 Airway Equipment and Method: Stylet Placement Confirmation: ETT inserted through vocal cords under direct vision,  positive ETCO2 and breath sounds checked- equal and bilateral Secured at: 22 cm Tube secured with: Tape Dental Injury: Teeth and Oropharynx as per pre-operative assessment

## 2018-02-04 NOTE — Op Note (Signed)
Patient Name:           Daniel Skinner   Date of Surgery:        02/04/2018  Pre op Diagnosis:      Biliary dyskinesia and acalculous cholecystitis   Post op Diagnosis:    Same   Procedure:                 Laparoscopic cholecystectomy  Surgeon:                     Angelia Mould. Derrell Lolling, M.D., FACS  Assistant:                      Barnetta Chapel, PA   Indication for Assistant: Expedite case.  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  Recent episode was associated with vomiting and hospitalization.  Ultrasound of gallbladder is normal.  CT scan of abdomen is unrevealing.  Liver function test are normal.  Upper endoscopy is negative.  Hepatobiliary scan is abnormal showing decreased ejection fraction and he had epigastric pain when he was given a fatty meal.  Given his work-up and family history of cholecystectomy we offered cholecystectomy to him.  He wants to do this and he is brought to the operating room for cholecystectomy during inpatient hospitalization  Operative Findings:       At the gallbladder was slightly edematous, not much.  There were notable adhesions of the omentum to the gallbladder suggesting prior inflammation.  The cystic duct was tiny.  The anatomy of the cystic duct and cystic artery were conventional.  We did not feel the angiogram was absolutely indicated.  Procedure in Detail:          Following the induction of general endotracheal anesthesia the patient's abdomen was prepped and draped in a sterile fashion.  Intravenous antibiotics were given.  Surgical timeout was performed.  0.5% Marcaine with epinephrine was used as a local infiltration anesthetic.  A vertical incision was made in the lower rim of the umbilicus.  The fascia was incised in the midline and the abdominal cavity entered under  direct vision.  An 11 mm Hassan trocar was inserted and secured with a pursestring suture of 0 Vicryl.  Pneumoperitoneum was treated.  Video camera was inserted with visualization and findings as described above.  A trocar was placed in the subxiphoid region and 2 trochars placed in the right upper quadrant.    The gallbladder was elevated.  Adhesions were carefully taken down.  We isolated the cystic artery as it went on to the wall of the gallbladder, secured it with multiple metal clips and divided it.  We created a large window behind the cystic duct, a good critical view.  The cystic duct was secured with multiple metal clips and divided.  The gallbladder was then dissected from its bed with electrocautery placed in a specimen bag and removed.  I made one small hole in the gallbladder and thick clear bile came out.  There were no stones.  All this was evacuated.      The subphrenic space and subhepatic space were copiously irrigated with 1 L of saline.  At the completion of the case there was no bleeding or bile leak.  The trochars were removed.  The pneumoperitoneum was released.  The fascia at the umbilicus  was closed with 0 Vicryl sutures and the skin  closed with subcuticular 4-0 Monocryl and Dermabond.  The patient tolerated procedure very well and was taken to PACU in stable condition.  EBL 10 cc.  Counts correct.  Complications none.     Angelia MouldHaywood M. Derrell LollingIngram, M.D., FACS General and Minimally Invasive Surgery Breast and Colorectal Surgery  02/04/2018 1:08 PM
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
## 2018-02-04 NOTE — Discharge Instructions (Signed)
Please arrive at least 30 min before your appointment to complete your check in paperwork.  If you are unable to arrive 30 min prior to your appointment time we may have to cancel or reschedule you.  LAPAROSCOPIC SURGERY: POST OP INSTRUCTIONS  1. DIET: Follow a light bland diet the first 24 hours after arrival home, such as soup, liquids, crackers, etc. Be sure to include lots of fluids daily. Avoid fast food or heavy meals as your are more likely to get nauseated. Eat a low fat the next few days after surgery.  2. Take your usually prescribed home medications unless otherwise directed. 3. PAIN CONTROL:  1. Pain is best controlled by a usual combination of three different methods TOGETHER:  i. Ice/Heat ii. Over the counter pain medication iii. Prescription pain medication 2. Most patients will experience some swelling and bruising around the incisions. Ice packs or heating pads (30-60 minutes up to 6 times a day) will help. Use ice for the first few days to help decrease swelling and bruising, then switch to heat to help relax tight/sore spots and speed recovery. Some people prefer to use ice alone, heat alone, alternating between ice & heat. Experiment to what works for you. Swelling and bruising can take several weeks to resolve.  3. It is helpful to take an over-the-counter pain medication regularly for the first few weeks. Choose one of the following that works best for you:  i. Naproxen (Aleve, etc) Two 220mg  tabs twice a day ii. Ibuprofen (Advil, etc) Three 200mg  tabs four times a day (every meal & bedtime) iii. Acetaminophen (Tylenol, etc) 500-650mg  four times a day (every meal & bedtime) 4. A prescription for pain medication (such as oxycodone, hydrocodone, etc) should be given to you upon discharge. Take your pain medication as prescribed.  i. If you are having problems/concerns with the prescription medicine (does not control pain, nausea, vomiting, rash, itching, etc), please call us [phone]  [phone] to see if we need to switch you to a different pain medicine that will work better for you and/or control your side effect better. ii. If you need a refill on your pain medication, please contact your pharmacy. They will contact our office to request authorization. Prescriptions will not be filled after 5 pm or on week-ends. 1. Avoid getting constipated. Between the surgery and the pain medications, it is common to experience some constipation. Increasing fluid intake and taking a fiber supplement (such as Metamucil, Citrucel, FiberCon, MiraLax, etc) 1-2 times a day regularly will usually help prevent this problem from occurring. A mild laxative (prune juice, Milk of Magnesia, MiraLax, etc) should be taken according to package directions if there are no bowel movements after 48 hours.  2. Watch out for diarrhea. If you have many loose bowel movements, simplify your diet to bland foods & liquids for a few days. Stop any stool softeners and decrease your fiber supplement. Switching to mild anti-diarrheal medications (Kayopectate, Pepto Bismol) can help. If this worsens or does not improve, please call us. 3. Wash / shower every day. You may shower over the dressings as they are waterproof. Continue to shower over incision(s) after the dressing is off. 4. Remove your waterproof bandages 5 days after surgery. You may leave the incision open to air. You may replace a dressing/Band-Aid to cover the incision for comfort if you wish.  5. ACTIVITIES as tolerated:  a. You may resume regular (light) daily activities beginning the next day--such as daily self-care, walking, climbing stairs--gradually  increasing activities as tolerated. If you can walk 30 minutes without difficulty, it is safe to try more intense activity such as jogging, treadmill, bicycling, low-impact aerobics, swimming, etc. °b. Save the most intensive and strenuous activity for last such as sit-ups, heavy lifting, contact sports, etc Refrain  from any heavy lifting or straining until you are off narcotics for pain control.  °c. DO NOT PUSH THROUGH PAIN. Let pain be your guide: If it hurts to do something, don't do it. Pain is your body warning you to avoid that activity for another week until the pain goes down. °d. You may drive when you are no longer taking prescription pain medication, you can comfortably wear a seatbelt, and you can safely maneuver your car and apply brakes. °e. You may have sexual intercourse when it is comfortable.  °6. FOLLOW UP in our office  °a. Please call [facility] at [phone] to set up an appointment to see your surgeon in the office for a follow-up appointment approximately 2-3 weeks after your surgery. °b. Make sure that you call for this appointment the day you arrive home to insure a convenient appointment time. °     10. IF YOU HAVE DISABILITY OR FAMILY LEAVE FORMS, BRING THEM TO THE               OFFICE FOR PROCESSING.  ° °WHEN TO CALL US [phone]:  °1. Poor pain control °2. Reactions / problems with new medications (rash/itching, nausea, etc)  °3. Fever over 101.5 F (38.5 C) °4. Inability to urinate °5. Nausea and/or vomiting °6. Worsening swelling or bruising °7. Continued bleeding from incision. °8. Increased pain, redness, or drainage from the incision ° °The clinic staff is available to answer your questions during regular business hours (8:30am-5pm). Please don’t hesitate to call and ask to speak to one of our nurses for clinical concerns.  °If you have a medical emergency, go to the nearest emergency room or call 911.  °A surgeon from [facility] is always on call at the hospitals  ° °[facility]  °[address], [address], [place], [place] [address] ?  °MAIN: [phone] ? TOLL FREE: [phone] ?  °FAX [phone]  °[email or web address] ° °

## 2018-02-04 NOTE — Progress Notes (Signed)
4th floor notified pt returning room in 20 minutes

## 2018-02-04 NOTE — Progress Notes (Addendum)
Show no acute findings [facility] PROGRESS NOTE    Progress Note  Daniel Skinner  XLK:[id number] DOB: [birth year] DOA: 01/31/2018 PCP: Patient, No Pcp Per     Brief Narrative:   Daniel Skinner is an 42 y.o. male without a significant past medical history who presented with intermittent epigastric pain over the last 4 days from the navel to the sternum radiates to the right quadrant, CT scan of the abdomen and pelvis was done  Assessment/Plan:   Principal Problem:   Recurrent abdominal pain CT scan of the abdomen and pelvis with oral and IV contrast on 02/01/2018 no acute findings. EGD by GI on 01/2018 showed no acute findings. Abdominal ultrasound on 01/28/2018 showed no acute findings GI recommended HIDA scan that showed biliary dyskinesia with pain with CCK. Surgery was consulted who recommended surgical intervention the risk and benefits were explained to the patient.  Including that this might not help with the pain and he and his girlfriend understand. His pain has been controlled with Toradol. For laparoscopic cholecystectomy on 02/04/2018.  DVT prophylaxis: lovenox Family Communication:mother and father Disposition Plan/Barrier to D/C: none Code Status:     Code Status Orders  (From admission, onward)        Start     Ordered   01/31/18 2254  Full code  Continuous     01/31/18 2255    Code Status History    This patient has a current code status but no historical code status.        IV Access:    Peripheral IV   Procedures and diagnostic studies:   Nm Hepato W/eject Fract  Result Date: 02/03/2018 CLINICAL DATA:  Right upper quadrant pain for 1 week and nausea EXAM: NUCLEAR MEDICINE HEPATOBILIARY IMAGING WITH GALLBLADDER EF VIEWS: Anterior right upper quadrant RADIOPHARMACEUTICALS:  5.4 mCi Tc-35m  Choletec IV COMPARISON:  None. FINDINGS: Liver uptake of radiotracer is normal. There is prompt visualization of gallbladder and small bowel, indicating  patency of the cystic and common bile ducts. The patient consumed 8 ounces of Ensure orally with calculation of the computer generated ejection fraction of radiotracer from the gallbladder. The patient experience pain with the oral Ensure consumption. The computer generated ejection fraction of radiotracer from the gallbladder is diminished at 13.3%, normal greater than 33% using the oral agent. IMPRESSION: Abnormally low ejection fraction of radiotracer from the gallbladder, a finding felt to be indicative of biliary dyskinesia. Patient experience pain with the oral Ensure consumption. Cystic and common bile ducts are patent as is evidenced by visualization of gallbladder and small bowel. Electronically Signed   By: Bretta Bang III M.D.   On: 02/03/2018 13:06     Medical Consultants:    None.  Anti-Infectives:   None  Subjective:    Daniel Skinner he relates he continues to have abdominal pain.  Objective:    Vitals:   02/03/18 0544 02/03/18 1635 02/03/18 2128 02/04/18 0440  BP: 133/75 136/79 (!) 144/72 110/66  Pulse: (!) 54 66 (!) 57 (!) 53  Resp: 18 17 20 20   Temp: 98.2 F (36.8 C) 98.3 F (36.8 C) 98.3 F (36.8 C) 98 F (36.7 C)  TempSrc: Oral Oral Oral Oral  SpO2: 100% 100% 100% 100%  Weight:      Height:        Intake/Output Summary (Last 24 hours) at 02/04/2018 0813 Last data filed at 02/04/2018 0700 Gross per 24 hour  Intake 1913.7 ml  Output -  Net 1913.7 ml   Filed Weights   02/01/18 0601  Weight: 70.3 kg (155 lb)    Exam: General exam: In no acute distress. Respiratory system: Good air movement and clear to auscultation. Cardiovascular system: S1 & S2 heard, RRR. No JVD. Gastrointestinal system: Abdomen is nondistended, soft and nontender.  Central nervous system: Alert and oriented. No focal neurological deficits. Extremities: No pedal edema. Skin: No rashes, lesions or ulcers Psychiatry: Judgement and insight appear normal. Mood & affect  appropriate.    Data Reviewed:    Labs: Basic Metabolic Panel: Recent Labs  Lab 01/31/18 0313 01/31/18 1800 02/01/18 0518  NA 142 139 142  K 3.9 3.9 4.5  CL 103 104 107  CO2 31 24 25   GLUCOSE 105* 106* 101*  BUN 12 10 12   CREATININE 1.14 1.09 1.10  CALCIUM 9.3 9.8 9.3   GFR Estimated Creatinine Clearance: 86.4 mL/min (by C-G formula based on SCr of 1.1 mg/dL). Liver Function Tests: Recent Labs  Lab 01/31/18 0313 01/31/18 1800 02/01/18 0518  AST 25 27 23   ALT 27 29 25   ALKPHOS 33* 37* 36*  BILITOT 0.6 0.9 1.0  PROT 7.7 7.7 7.0  ALBUMIN 4.8 4.9 4.4   Recent Labs  Lab 01/31/18 0313 01/31/18 1800  LIPASE 35 42   No results for input(s): AMMONIA in the last 168 hours. Coagulation profile No results for input(s): INR, PROTIME in the last 168 hours.  CBC: Recent Labs  Lab 01/31/18 0313 01/31/18 1800 02/01/18 0518  WBC 4.8 10.4 8.9  HGB 15.9 17.1* 16.7  HCT 45.4 49.1 48.4  MCV 87.1 87.2 88.0  PLT 271 296 306   Cardiac Enzymes: No results for input(s): CKTOTAL, CKMB, CKMBINDEX, TROPONINI in the last 168 hours. BNP (last 3 results) No results for input(s): PROBNP in the last 8760 hours. CBG: No results for input(s): GLUCAP in the last 168 hours. D-Dimer: No results for input(s): DDIMER in the last 72 hours. Hgb A1c: No results for input(s): HGBA1C in the last 72 hours. Lipid Profile: No results for input(s): CHOL, HDL, LDLCALC, TRIG, CHOLHDL, LDLDIRECT in the last 72 hours. Thyroid function studies: No results for input(s): TSH, T4TOTAL, T3FREE, THYROIDAB in the last 72 hours.  Invalid input(s): FREET3 Anemia work up: Recent Labs    02/01/18 0958  VITAMINB12 424  FOLATE 17.2  FERRITIN 252  TIBC 497*  IRON 107  RETICCTPCT 1.0   Sepsis Labs: Recent Labs  Lab 01/31/18 0313 01/31/18 1800 01/31/18 2330 02/01/18 0518  WBC 4.8 10.4  --  8.9  LATICACIDVEN  --   --  0.8  --    Microbiology Recent Results (from the past 240 hour(s))    Surgical pcr screen     Status: None   Collection Time: 02/04/18  5:57 AM  Result Value Ref Range Status   MRSA, PCR NEGATIVE NEGATIVE Final   Staphylococcus aureus NEGATIVE NEGATIVE Final    Comment: (NOTE) The Xpert SA Assay (FDA approved for NASAL specimens in patients 63 years of age and older), is one component of a comprehensive surveillance program. It is not intended to diagnose infection nor to guide or monitor treatment. Performed at [facility], [address], [place], [place] [address]      Medications:   . acetaminophen  1,000 mg Oral On Call to OR  . celecoxib  200 mg Oral On Call to OR  . enoxaparin (LOVENOX) injection  40 mg Subcutaneous QHS  . gabapentin  300 mg  Oral On Call to OR  . pantoprazole  40 mg Oral BID AC  . sucralfate  1 g Oral TID WC & HS   Continuous Infusions: . sodium chloride Stopped (02/01/18 1247)  . sodium chloride 75 mL/hr at 02/04/18 0307  . ciprofloxacin    . ciprofloxacin    . vancomycin       LOS: 2 days   Daniel Skinner  [facility] Pager [phone]  *Please refer to [email or web address], password [email or web address] to get updated schedule on who will round on this patient, as hospitalists switch teams weekly. If 7PM-7AM, please contact night-coverage at [email or web address], password [email or web address] for any overnight needs.  02/04/2018, 8:13 AM

## 2018-02-04 NOTE — Progress Notes (Signed)
Surgery:  Had basically an uneventful night Wanted some clear liquids and tolerated those but said it did increase his pain Has been n.p.o. since midnight Scheduled for laparoscopic cholecystectomy this morning  Tayli Buch M. Derrell LollingIngram, M.D., [facility] General and Minimally invasive Surgery Breast and Colorectal Surgery Office:   [phone]

## 2018-02-04 NOTE — Transfer of Care (Signed)
Immediate Anesthesia Transfer of Care Note  Patient: Daniel Skinner  Procedure(s) Performed: LAPAROSCOPIC CHOLECYSTECTOMY (N/A )  Patient Location: PACU  Anesthesia Type:General  Level of Consciousness: awake, alert  and patient cooperative  Airway & Oxygen Therapy: Patient Spontanous Breathing and Patient connected to face mask oxygen  Post-op Assessment: Report given to RN and Post -op Vital signs reviewed and stable  Post vital signs: Reviewed and stable  Last Vitals:  Vitals Value Taken Time  BP    Temp    Pulse 80 02/04/2018  1:17 PM  Resp 18 02/04/2018  1:17 PM  SpO2 100 % 02/04/2018  1:17 PM  Vitals shown include unvalidated device data.  Last Pain:  Vitals:   02/04/18 1139  TempSrc: Oral  PainSc:       Patients Stated Pain Goal: 1 (02/04/18 0909)  Complications: No apparent anesthesia complications

## 2018-02-04 NOTE — Anesthesia Preprocedure Evaluation (Addendum)
Anesthesia Evaluation  Patient identified by MRN, date of birth, ID band Patient awake    Reviewed: Allergy & Precautions, NPO status , Patient's Chart, lab work & pertinent test results  History of Anesthesia Complications Negative for: history of anesthetic complications  Airway Mallampati: I  TM Distance: >3 FB Neck ROM: Full    Dental  (+) Teeth Intact, Dental Advisory Given   Pulmonary neg pulmonary ROS,    breath sounds clear to auscultation       Cardiovascular negative cardio ROS   Rhythm:Regular Rate:Normal     Neuro/Psych negative neurological ROS  negative psych ROS   GI/Hepatic Neg liver ROS, cholecystitis   Endo/Other  negative endocrine ROS  Renal/GU negative Renal ROS  negative genitourinary   Musculoskeletal negative musculoskeletal ROS (+)   Abdominal   Peds  Hematology negative hematology ROS (+)   Anesthesia Other Findings   Reproductive/Obstetrics                            Anesthesia Physical Anesthesia Plan  ASA: I  Anesthesia Plan: General   Post-op Pain Management:    Induction: Intravenous  PONV Risk Score and Plan: 1 and Ondansetron, Dexamethasone, Treatment may vary due to age or medical condition and Midazolam  Airway Management Planned: Oral ETT  Additional Equipment:   Intra-op Plan:   Post-operative Plan: Extubation in OR  Informed Consent: I have reviewed the patients History and Physical, chart, labs and discussed the procedure including the risks, benefits and alternatives for the proposed anesthesia with the patient or authorized representative who has indicated his/her understanding and acceptance.   Dental advisory given  Plan Discussed with: CRNA  Anesthesia Plan Comments:         Anesthesia Quick Evaluation

## 2018-02-05 ENCOUNTER — Encounter (HOSPITAL_COMMUNITY): Payer: Self-pay | Admitting: General Surgery

## 2018-03-27 ENCOUNTER — Emergency Department (HOSPITAL_COMMUNITY): Payer: 59

## 2018-03-27 ENCOUNTER — Emergency Department (HOSPITAL_COMMUNITY)
Admission: EM | Admit: 2018-03-27 | Discharge: 2018-03-27 | Disposition: A | Discharge status: Home / Self Care | Payer: 59 | Attending: Emergency Medicine | Admitting: Emergency Medicine

## 2018-03-27 ENCOUNTER — Other Ambulatory Visit: Payer: Self-pay

## 2018-03-27 DIAGNOSIS — M25551 Pain in right hip: Secondary | ICD-10-CM

## 2018-03-27 DIAGNOSIS — M25511 Pain in right shoulder: Secondary | ICD-10-CM

## 2018-03-27 DIAGNOSIS — Y939 Activity, unspecified: Secondary | ICD-10-CM | POA: Diagnosis not present

## 2018-03-27 DIAGNOSIS — Y990 Civilian activity done for income or pay: Secondary | ICD-10-CM | POA: Diagnosis not present

## 2018-03-27 DIAGNOSIS — Y929 Unspecified place or not applicable: Secondary | ICD-10-CM | POA: Diagnosis not present

## 2018-03-27 DIAGNOSIS — S0990XA Unspecified injury of head, initial encounter: Secondary | ICD-10-CM | POA: Insufficient documentation

## 2018-03-27 DIAGNOSIS — R51 Headache: Secondary | ICD-10-CM | POA: Insufficient documentation

## 2018-03-27 DIAGNOSIS — M542 Cervicalgia: Secondary | ICD-10-CM | POA: Diagnosis present

## 2018-03-27 MED ORDER — IBUPROFEN 600 MG PO TABS: 600.0000 mg | ORAL_TABLET | Freq: Four times a day (QID) | ORAL | 0 refills | Status: AC | PRN

## 2018-03-27 MED ORDER — METHOCARBAMOL 500 MG PO TABS: 500.0000 mg | ORAL_TABLET | Freq: Two times a day (BID) | ORAL | 0 refills | Status: AC

## 2018-03-27 MED ORDER — IBUPROFEN 200 MG PO TABS
600.0000 mg | ORAL_TABLET | Freq: Once | ORAL | Status: AC
  Administered 2018-03-27: 600 mg via ORAL
  Filled 2018-03-27: qty 3

## 2018-03-27 NOTE — Discharge Instructions (Signed)
The pain your experiencing is likely due to muscle strain, you may take Ibuprofen and Robaxin as needed for pain management. Do not combine with any pain reliever other than tylenol. The muscle soreness should improve over the next week. Follow up with your family doctor in the next week for a recheck if you are still having symptoms. Return to ED if pain is worsening, you develop weakness or numbness of extremities, or new or concerning symptoms develop.  You were examined today for a head injury. Your head CT showed no evidence of  Injury today.   Sometimes serious problems can develop after a head injury. Please return to the emergency department if you experience any of the following symptoms: Repeated vomiting Headache that gets worse and does not go away Loss of consciousness or inability to stay awake at times when you normally would be able to Getting more confused, restless or agitated Convulsions or seizures Difficulty walking or feeling off balance Weakness or numbness Vision changes
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
## 2019-03-31 ENCOUNTER — Telehealth: Payer: Self-pay

## 2019-03-31 NOTE — Telephone Encounter (Signed)
Called pt to schedule a new patient appointment with Dr. Christopher. Left message to call back.  

## 2019-04-02 NOTE — Telephone Encounter (Signed)
Called pt x 2 to scheduled new patient appointment. Left message to call back.

## 2019-04-08 NOTE — Telephone Encounter (Signed)
Called pt x 3 to schedule new patient appointment. Left message for pt to call back and speak with scheduler to be added.

## 2019-09-16 IMAGING — CT CT ABD-PELV W/ CM
2 of 5 series · 16 of 46 positions shown, 18 images · IV contrast (ISOVUE)
Comparison: Ultrasound from earlier in the same day.

CLINICAL DATA: Right-sided abdominal pain for several hours

EXAM:
CT ABDOMEN AND PELVIS WITH CONTRAST
TECHNIQUE: Multidetector CT imaging of the abdomen and pelvis was performed
using the standard protocol following bolus administration of
intravenous contrast.
CONTRAST:  100mL [id number] IOPAMIDOL ([id number]) INJECTION 61%

[Series 2: axial st · axial · 0.67mm/px · z∈[-448,-84]mm · 13 of 85 slices shown, 15 images]
[im 6/85  soft-tissue]
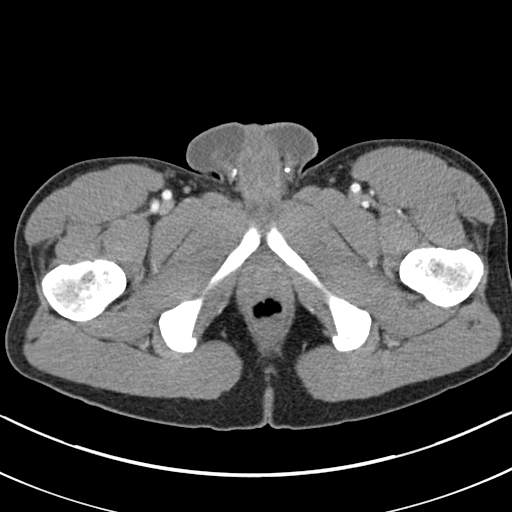
[im 6/85  bone]
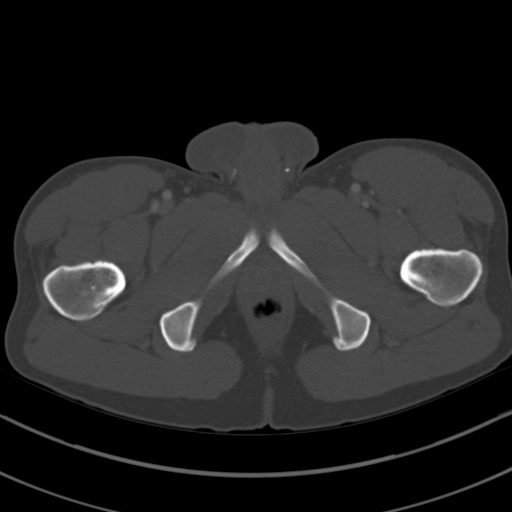
[im 11/85  soft-tissue]
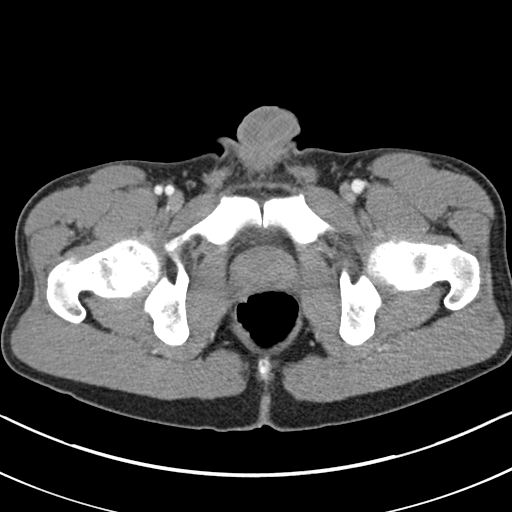
[im 16/85  soft-tissue]
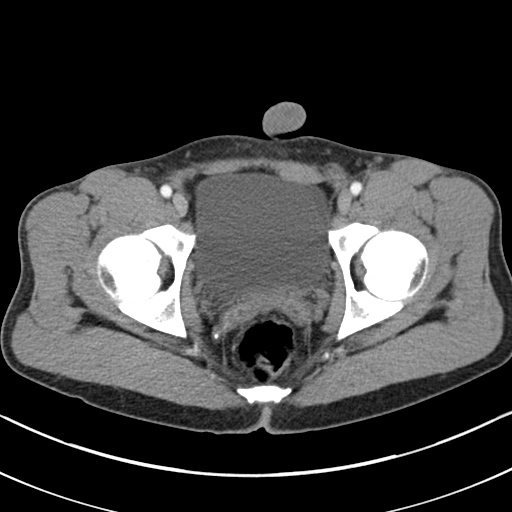
[im 27/85  soft-tissue]
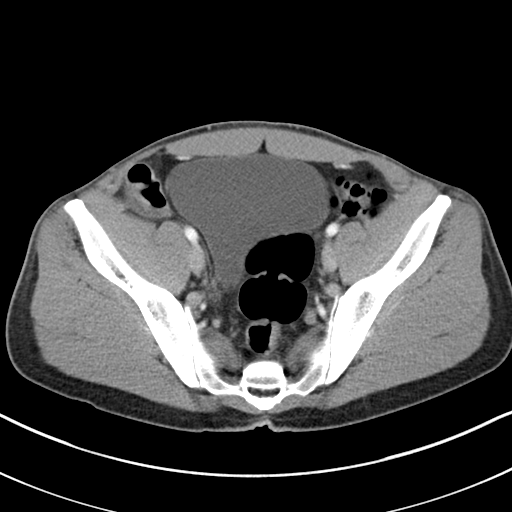
[im 32/85  soft-tissue]
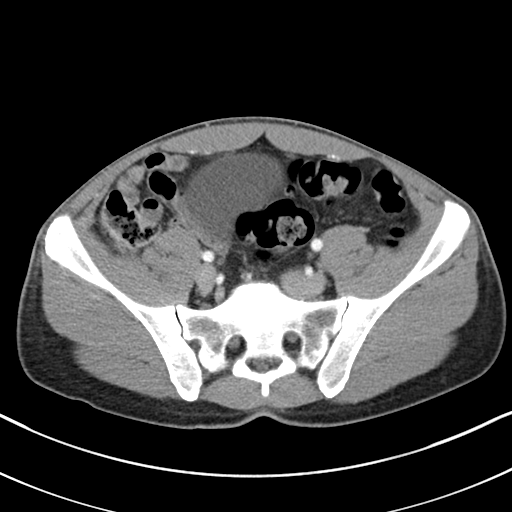
[im 37/85  soft-tissue]
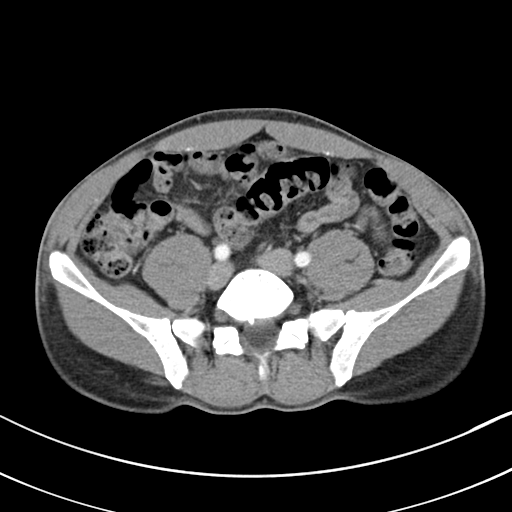
[im 43/85  soft-tissue]
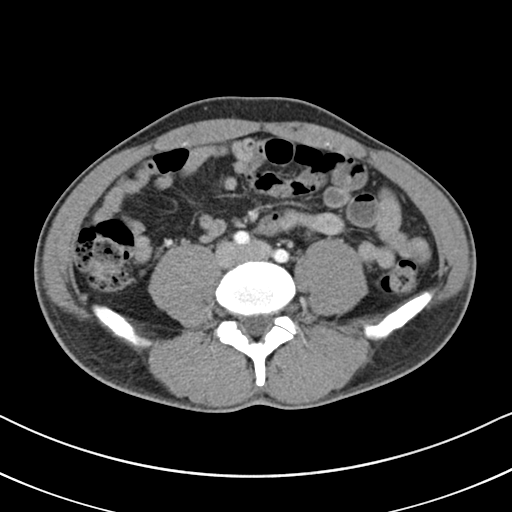
[im 48/85  soft-tissue]
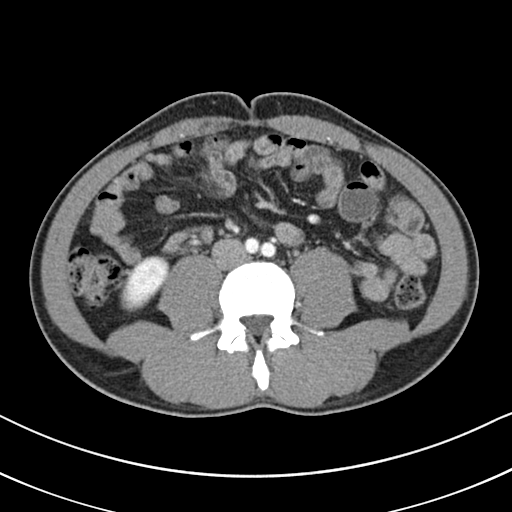
[im 53/85  soft-tissue]
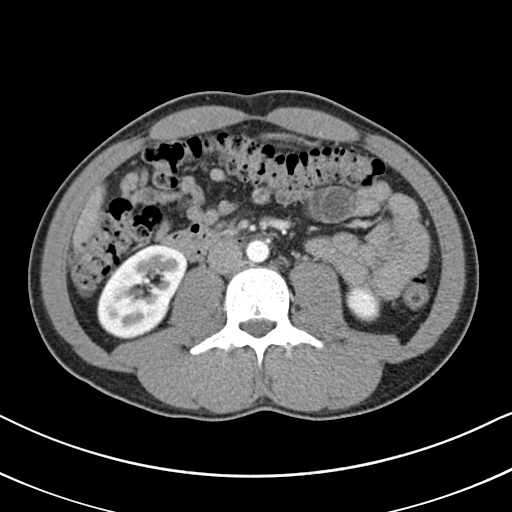
[im 53/85  bone]
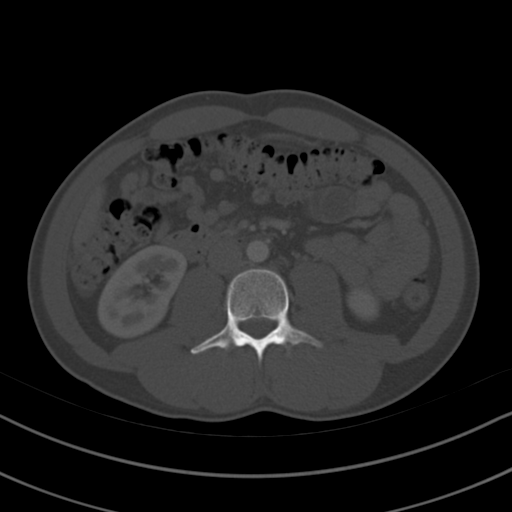
[im 58/85  soft-tissue]
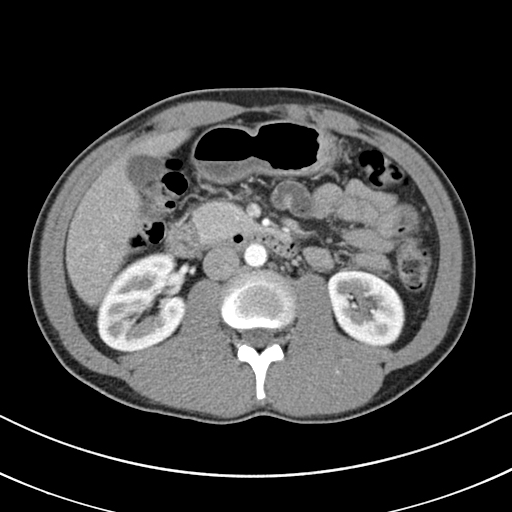
[im 69/85  soft-tissue]
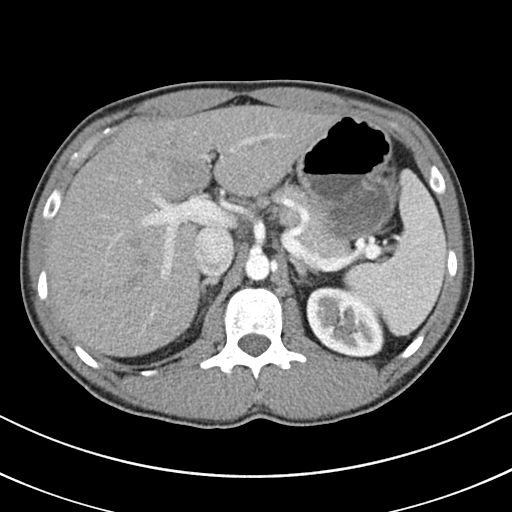
[im 74/85  soft-tissue]
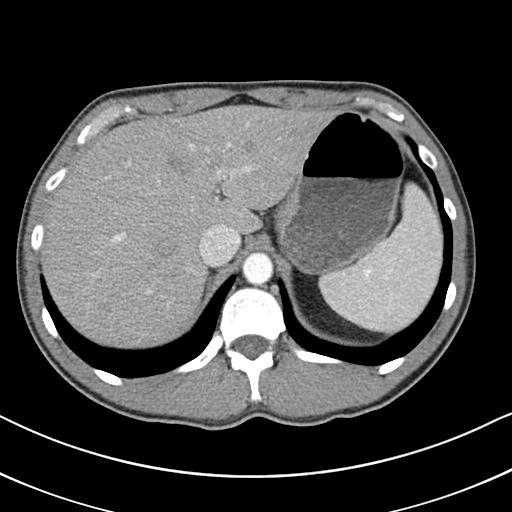
[im 79/85  soft-tissue]
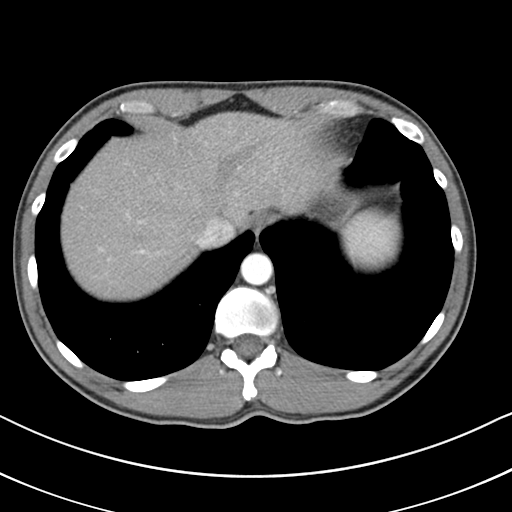

[Series 5: coronal st · coronal · 0.68mm/px · 3 of 79 slices shown]
[im 27/79  soft-tissue]
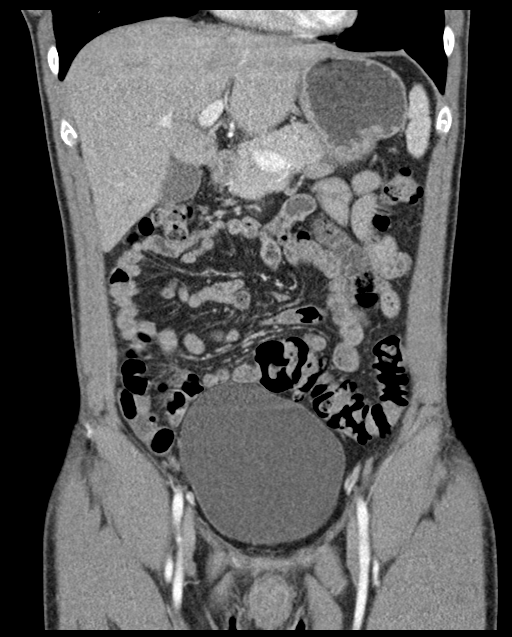
[im 35/79  soft-tissue]
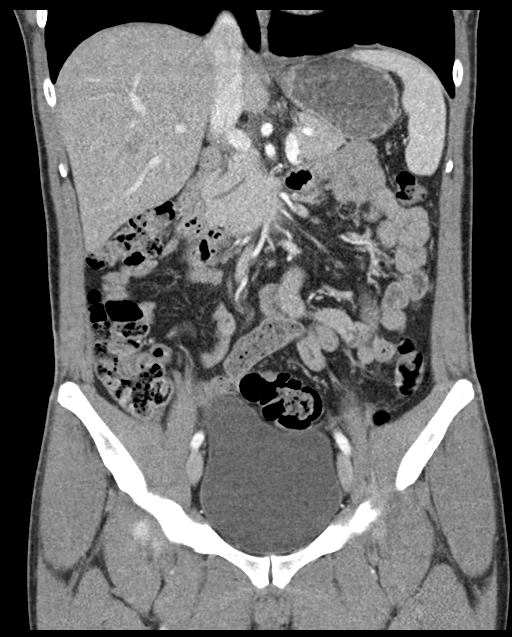
[im 44/79  soft-tissue]
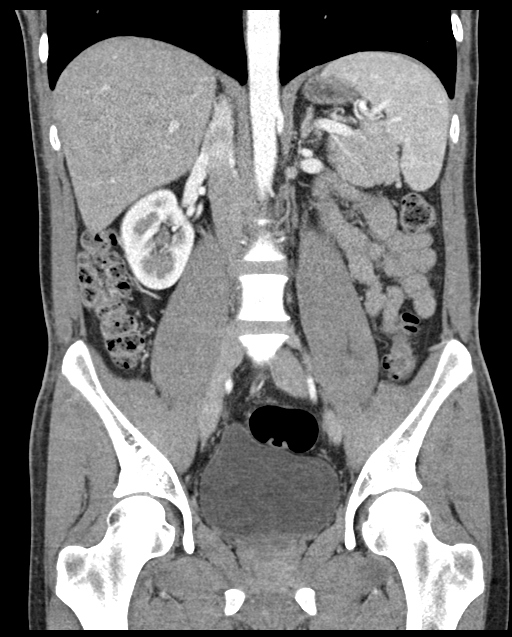

[16 of 46 positions shown; findings below may reference images not displayed]

FINDINGS: Lower chest: No acute abnormality.

Hepatobiliary: No focal liver abnormality is seen. No gallstones,
gallbladder wall thickening, or biliary dilatation.

Pancreas: Unremarkable. No pancreatic ductal dilatation or
surrounding inflammatory changes.

Spleen: Normal in size without focal abnormality.

Adrenals/Urinary Tract: Adrenal glands are within normal limits. The
kidneys are well visualized bilaterally within normal enhancement
pattern. No renal calculi or urinary tract obstructive changes are
seen. The bladder is well distended.

Stomach/Bowel: Stomach is within normal limits. Appendix appears
normal. No evidence of bowel wall thickening, distention, or
inflammatory changes.

Vascular/Lymphatic: No significant vascular findings are present. No
enlarged abdominal or pelvic lymph nodes.

Reproductive: Prostate is unremarkable.

Other: No abdominal wall hernia or abnormality. No abdominopelvic
ascites.

Musculoskeletal: No acute or significant osseous findings.
IMPRESSION: No acute abnormality noted.

## 2020-03-25 ENCOUNTER — Emergency Department (HOSPITAL_COMMUNITY)
Admission: EM | Admit: 2020-03-25 | Discharge: 2020-03-25 | Disposition: A | Discharge status: Home / Self Care | Payer: No Typology Code available for payment source | Attending: Emergency Medicine | Admitting: Emergency Medicine

## 2020-03-25 ENCOUNTER — Emergency Department (HOSPITAL_COMMUNITY): Payer: No Typology Code available for payment source

## 2020-03-25 ENCOUNTER — Other Ambulatory Visit: Payer: Self-pay

## 2020-03-25 ENCOUNTER — Encounter (HOSPITAL_COMMUNITY): Payer: Self-pay

## 2020-03-25 DIAGNOSIS — S93491A Sprain of other ligament of right ankle, initial encounter: Secondary | ICD-10-CM

## 2020-03-25 DIAGNOSIS — W010XXA Fall on same level from slipping, tripping and stumbling without subsequent striking against object, initial encounter: Secondary | ICD-10-CM | POA: Insufficient documentation

## 2020-03-25 DIAGNOSIS — S99911A Unspecified injury of right ankle, initial encounter: Secondary | ICD-10-CM | POA: Diagnosis present

## 2020-03-25 DIAGNOSIS — S93431A Sprain of tibiofibular ligament of right ankle, initial encounter: Secondary | ICD-10-CM | POA: Diagnosis not present

## 2020-03-25 DIAGNOSIS — Y9339 Activity, other involving climbing, rappelling and jumping off: Secondary | ICD-10-CM | POA: Diagnosis not present
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
## 2020-03-25 NOTE — Discharge Instructions (Signed)
Apply ice to areas of injury 3-4 times per day to limit inflammation.  Take Advil or Aleve for pain/inflammation.  Continue bracing for support, as needed.  We recommend follow-up with a primary care doctor to ensure resolution of symptoms.  Return to the ED for any new or concerning symptoms.

## 2022-06-04 ENCOUNTER — Other Ambulatory Visit: Payer: Self-pay | Admitting: Nurse Practitioner

## 2022-06-04 ENCOUNTER — Ambulatory Visit
Admission: RE | Admit: 2022-06-04 | Discharge: 2022-06-04 | Disposition: A | Discharge status: Home / Self Care | Payer: No Typology Code available for payment source | Source: Ambulatory Visit | Attending: Nurse Practitioner | Admitting: Nurse Practitioner

## 2022-06-04 DIAGNOSIS — Z0289 Encounter for other administrative examinations: Secondary | ICD-10-CM
